# Patient Record
Sex: Female | Born: 1980 | Race: White | Hispanic: No | Marital: Single | Smoking: Never smoker
Health system: Southern US, Community
[De-identification: ages and names within clinical notes are randomized; demographics above are authoritative.]

## PROBLEM LIST (undated history)

## (undated) DIAGNOSIS — R112 Nausea with vomiting, unspecified: Secondary | ICD-10-CM

## (undated) DIAGNOSIS — T7840XA Allergy, unspecified, initial encounter: Secondary | ICD-10-CM

## (undated) DIAGNOSIS — Z9889 Other specified postprocedural states: Secondary | ICD-10-CM

## (undated) DIAGNOSIS — F419 Anxiety disorder, unspecified: Secondary | ICD-10-CM

## (undated) DIAGNOSIS — M2550 Pain in unspecified joint: Secondary | ICD-10-CM

## (undated) DIAGNOSIS — K219 Gastro-esophageal reflux disease without esophagitis: Secondary | ICD-10-CM

## (undated) DIAGNOSIS — E559 Vitamin D deficiency, unspecified: Secondary | ICD-10-CM

## (undated) DIAGNOSIS — B019 Varicella without complication: Secondary | ICD-10-CM

## (undated) DIAGNOSIS — M329 Systemic lupus erythematosus, unspecified: Secondary | ICD-10-CM

## (undated) DIAGNOSIS — F429 Obsessive-compulsive disorder, unspecified: Secondary | ICD-10-CM

## (undated) DIAGNOSIS — R7611 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 without active tuberculosis: Secondary | ICD-10-CM

## (undated) DIAGNOSIS — O039 Complete or unspecified spontaneous abortion without complication: Secondary | ICD-10-CM

## (undated) DIAGNOSIS — R110 Nausea: Secondary | ICD-10-CM

## (undated) DIAGNOSIS — F329 Major depressive disorder, single episode, unspecified: Secondary | ICD-10-CM

## (undated) DIAGNOSIS — F32A Depression, unspecified: Secondary | ICD-10-CM

## (undated) DIAGNOSIS — G43909 Migraine, unspecified, not intractable, without status migrainosus: Secondary | ICD-10-CM

## (undated) HISTORY — DX: Gastro-esophageal reflux disease without esophagitis: K21.9

## (undated) HISTORY — DX: Allergy, unspecified, initial encounter: T78.40XA

## (undated) HISTORY — DX: Obsessive-compulsive disorder, unspecified: F42.9

## (undated) HISTORY — DX: Nausea: R11.0

## (undated) HISTORY — DX: Varicella without complication: B01.9

## (undated) HISTORY — DX: Vitamin D deficiency, unspecified: E55.9

## (undated) HISTORY — DX: Anxiety disorder, unspecified: F41.9

## (undated) HISTORY — DX: Complete or unspecified spontaneous abortion without complication: O03.9

## (undated) HISTORY — DX: 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 without active tuberculosis: R76.11

## (undated) HISTORY — DX: Pain in unspecified joint: M25.50

## (undated) HISTORY — DX: Major depressive disorder, single episode, unspecified: F32.9

## (undated) HISTORY — DX: Depression, unspecified: F32.A

---
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
## 2002-05-31 ENCOUNTER — Other Ambulatory Visit: Admission: RE | Admit: 2002-05-31 | Discharge: 2002-05-31 | Payer: Self-pay | Admitting: Family Medicine

## 2004-12-02 HISTORY — PX: DILATION AND CURETTAGE OF UTERUS: SHX78

## 2004-12-24 ENCOUNTER — Inpatient Hospital Stay: Payer: Self-pay

## 2006-06-27 DIAGNOSIS — G43011 Migraine without aura, intractable, with status migrainosus: Secondary | ICD-10-CM | POA: Insufficient documentation

## 2007-07-26 ENCOUNTER — Emergency Department: Payer: Self-pay | Admitting: Unknown Physician Specialty

## 2009-06-01 DIAGNOSIS — B3731 Acute candidiasis of vulva and vagina: Secondary | ICD-10-CM | POA: Insufficient documentation

## 2009-06-01 DIAGNOSIS — B373 Candidiasis of vulva and vagina: Secondary | ICD-10-CM | POA: Insufficient documentation

## 2009-08-25 DIAGNOSIS — F32A Depression, unspecified: Secondary | ICD-10-CM | POA: Insufficient documentation

## 2009-11-20 ENCOUNTER — Inpatient Hospital Stay: Payer: Self-pay | Admitting: Unknown Physician Specialty

## 2014-04-09 ENCOUNTER — Emergency Department: Payer: Self-pay | Admitting: Emergency Medicine

## 2016-06-17 ENCOUNTER — Ambulatory Visit (INDEPENDENT_AMBULATORY_CARE_PROVIDER_SITE_OTHER): Payer: BLUE CROSS/BLUE SHIELD | Admitting: Family Medicine

## 2016-06-17 ENCOUNTER — Encounter: Payer: Self-pay | Admitting: Family Medicine

## 2016-06-17 VITALS — BP 128/62 | HR 78 | Temp 99.2°F | Resp 16 | Ht 64.0 in | Wt 125.0 lb

## 2016-06-17 DIAGNOSIS — H7290 Unspecified perforation of tympanic membrane, unspecified ear: Secondary | ICD-10-CM | POA: Insufficient documentation

## 2016-06-17 DIAGNOSIS — G43011 Migraine without aura, intractable, with status migrainosus: Secondary | ICD-10-CM

## 2016-06-17 DIAGNOSIS — R5383 Other fatigue: Secondary | ICD-10-CM | POA: Insufficient documentation

## 2016-06-17 DIAGNOSIS — J309 Allergic rhinitis, unspecified: Secondary | ICD-10-CM | POA: Insufficient documentation

## 2016-06-17 DIAGNOSIS — L301 Dyshidrosis [pompholyx]: Secondary | ICD-10-CM | POA: Insufficient documentation

## 2016-06-17 DIAGNOSIS — F329 Major depressive disorder, single episode, unspecified: Secondary | ICD-10-CM | POA: Insufficient documentation

## 2016-06-17 DIAGNOSIS — E559 Vitamin D deficiency, unspecified: Secondary | ICD-10-CM | POA: Insufficient documentation

## 2016-06-17 DIAGNOSIS — Z3009 Encounter for other general counseling and advice on contraception: Secondary | ICD-10-CM | POA: Insufficient documentation

## 2016-06-17 DIAGNOSIS — D229 Melanocytic nevi, unspecified: Secondary | ICD-10-CM | POA: Insufficient documentation

## 2016-06-17 DIAGNOSIS — F32A Depression, unspecified: Secondary | ICD-10-CM | POA: Insufficient documentation

## 2016-06-17 DIAGNOSIS — R7611 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 without active tuberculosis: Secondary | ICD-10-CM | POA: Insufficient documentation

## 2016-06-17 MED ORDER — ZONISAMIDE 100 MG PO CAPS: 100.0000 mg | ORAL_CAPSULE | Freq: Every day | ORAL | Status: DC

## 2016-06-17 NOTE — Progress Notes (Signed)
Patient: Victoria Buckley Female    DOB: [birth year]   35 y.o.   MRN: [id number] Visit Date: 06/17/2016  Today's Provider: Vernie Murders, PA   Chief Complaint  Patient presents with  . Migraine   Subjective:    Migraine  This is a chronic problem. The problem occurs intermittently. The problem has been gradually worsening. The pain is located in the temporal region. The quality of the pain is described as aching, stabbing and dull. Associated symptoms include dizziness, nausea, neck pain and vomiting. She has tried NSAIDs for the symptoms. The treatment provided mild relief.  Patient reports that she does have a history of migraine headaches. Patient reports that her last one was about 1 month ago. Patient reports that her migraine tends to be more on the left side, and it radiates down her neck.    No past medical history on file. Patient Active Problem List   Diagnosis Date Noted  . Allergic rhinitis 06/17/2016  . Family planning 06/17/2016  . Clinical depression 06/17/2016  . 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 Fatigue 06/17/2016  . Benign melanoma 06/17/2016  . 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 06/17/2016  . Ear drum perforation 06/17/2016  . Avitaminosis D 06/17/2016  . Anxiety disorder 08/25/2009  . Candidal vulvovaginitis 06/01/2009  . Tobacco use 02/24/2008  . Intractable migraine without aura with status migrainosus 06/27/2006   No past surgical history on file. No family history on file.  Allergies  Allergen Reactions  . Citalopram Hives, Itching, Nausea Only and Shortness Of Breath   No current outpatient prescriptions on file prior to visit.   No current facility-administered medications on file prior to visit.   Review of Systems  Constitutional: Positive for fatigue.  Gastrointestinal: Positive for nausea and vomiting.  Musculoskeletal: Positive for neck pain.  Neurological: Positive for dizziness and headaches.    Social History    Substance Use Topics  . Smoking status: Never Smoker   . Smokeless tobacco: Not on file  . Alcohol Use: No   Objective:   BP 128/62 mmHg  Pulse 78  Temp(Src) 99.2 F (37.3 C)  Resp 16  Ht 5\' 4"  (1.626 m)  Wt 125 lb (56.7 kg)  BMI 21.45 kg/m2  LMP 06/17/2016 (Exact Date)  Physical Exam  Constitutional: She is oriented to person, place, and time. She appears well-developed and well-nourished.  HENT:  Head: Normocephalic.  Right Ear: External ear normal.  Left Ear: External ear normal.  Some tenderness in left temple back across occiput. Some photosensitivity at onset this morning. Better now.  Eyes: Conjunctivae are normal.  Neck: Neck supple.  Cardiovascular: Normal rate and regular rhythm.   Pulmonary/Chest: Effort normal and breath sounds normal.  Abdominal: Soft. Bowel sounds are normal.  Musculoskeletal: Normal range of motion.  Neurological: She is alert and oriented to person, place, and time.  Skin: No rash noted.  Psychiatric: She has a normal mood and affect. Her behavior is normal.      Assessment & Plan:     1. Intractable migraine without aura with status migrainosus Diagnosed with migraines "20 years ago" and had very little help from Topamax with Axert prn. Was finally switched to [place] by her neurologist because the Topamax caused tingling pains in her feet. Sometimes she can abort the headache if she take a BC. Recent headache did not respond to this regimen and was having some vomiting. No nausea or significant headache at the present. This has been  the 3rd migraine in the past 3 months. Started menses today. Cycles every 23-24 days with 3-4 day menses - very regular but not on any hormones. Will get follow up labs and restart the Zonegran at her previous dose. May use Aleve and a caffeinated beverage for headache. Recheck pending lab reports. Given headache diet restrictions. - zonisamide (ZONEGRAN) 100 MG capsule; Take 1 capsule (100 mg total) by mouth  daily.  Dispense: 30 capsule; Refill: 3 - CBC with Differential/Platelet - Comprehensive metabolic panel - TSH       Vernie Murders, PA  [facility]

## 2016-06-17 NOTE — Patient Instructions (Signed)

## 2016-06-18 ENCOUNTER — Encounter: Payer: Self-pay | Admitting: Family Medicine

## 2016-06-18 ENCOUNTER — Other Ambulatory Visit: Payer: Self-pay | Admitting: Family Medicine

## 2016-06-18 MED ORDER — CYCLOBENZAPRINE HCL 5 MG PO TABS: 5.0000 mg | ORAL_TABLET | Freq: Three times a day (TID) | ORAL | Status: DC | PRN

## 2016-06-18 MED ORDER — PROMETHAZINE HCL 25 MG PO TABS: 25.0000 mg | ORAL_TABLET | Freq: Three times a day (TID) | ORAL | Status: DC | PRN

## 2016-06-19 LAB — COMPREHENSIVE METABOLIC PANEL
ALT: 18 IU/L (ref 0–32)
AST: 19 IU/L (ref 0–40)
Albumin/Globulin Ratio: 1.8 (ref 1.2–2.2)
Albumin: 4.6 g/dL (ref 3.5–5.5)
Alkaline Phosphatase: 77 IU/L (ref 39–117)
BUN/Creatinine Ratio: 11 (ref 9–23)
BUN: 9 mg/dL (ref 6–20)
Bilirubin Total: 0.4 mg/dL (ref 0.0–1.2)
CO2: 21 mmol/L (ref 18–29)
Calcium: 9 mg/dL (ref 8.7–10.2)
Chloride: 98 mmol/L (ref 96–106)
Creatinine, Ser: 0.83 mg/dL (ref 0.57–1.00)
GFR calc Af Amer: 104 mL/min/{1.73_m2} (ref >59)
GFR calc non Af Amer: 90 mL/min/{1.73_m2} (ref >59)
Globulin, Total: 2.6 g/dL (ref 1.5–4.5)
Glucose: 93 mg/dL (ref 65–99)
Potassium: 4 mmol/L (ref 3.5–5.2)
Sodium: 136 mmol/L (ref 134–144)
Total Protein: 7.2 g/dL (ref 6.0–8.5)

## 2016-06-19 LAB — CBC WITH DIFFERENTIAL/PLATELET
Basophils Absolute: 0 10*3/uL (ref 0.0–0.2)
Basos: 0 %
EOS (ABSOLUTE): 0 10*3/uL (ref 0.0–0.4)
Eos: 0 %
Hematocrit: 41.9 % (ref 34.0–46.6)
Hemoglobin: 14.1 g/dL (ref 11.1–15.9)
Immature Grans (Abs): 0 10*3/uL (ref 0.0–0.1)
Immature Granulocytes: 0 %
Lymphocytes Absolute: 2.2 10*3/uL (ref 0.7–3.1)
Lymphs: 23 %
MCH: 32.3 pg (ref 26.6–33.0)
MCHC: 33.7 g/dL (ref 31.5–35.7)
MCV: 96 fL (ref 79–97)
Monocytes Absolute: 0.6 10*3/uL (ref 0.1–0.9)
Monocytes: 7 %
Neutrophils Absolute: 6.8 10*3/uL (ref 1.4–7.0)
Neutrophils: 70 %
Platelets: 244 10*3/uL (ref 150–379)
RBC: 4.37 x10E6/uL (ref 3.77–5.28)
RDW: 12.9 % (ref 12.3–15.4)
WBC: 9.7 10*3/uL (ref 3.4–10.8)

## 2016-06-19 LAB — TSH: TSH: 0.822 u[IU]/mL (ref 0.450–4.500)

## 2016-07-26 ENCOUNTER — Encounter: Payer: Self-pay | Admitting: Family Medicine

## 2016-08-02 ENCOUNTER — Encounter: Payer: BLUE CROSS/BLUE SHIELD | Admitting: Family Medicine

## 2016-09-05 ENCOUNTER — Emergency Department
Admission: EM | Admit: 2016-09-05 | Discharge: 2016-09-05 | Disposition: A | Discharge status: Home / Self Care | Payer: BLUE CROSS/BLUE SHIELD | Attending: Emergency Medicine | Admitting: Emergency Medicine

## 2016-09-05 ENCOUNTER — Encounter: Payer: Self-pay | Admitting: Emergency Medicine

## 2016-09-05 DIAGNOSIS — X58XXXA Exposure to other specified factors, initial encounter: Secondary | ICD-10-CM | POA: Diagnosis not present

## 2016-09-05 DIAGNOSIS — S0501XA Injury of conjunctiva and corneal abrasion without foreign body, right eye, initial encounter: Secondary | ICD-10-CM | POA: Insufficient documentation

## 2016-09-05 DIAGNOSIS — H5711 Ocular pain, right eye: Secondary | ICD-10-CM | POA: Diagnosis present

## 2016-09-05 DIAGNOSIS — Y999 Unspecified external cause status: Secondary | ICD-10-CM | POA: Diagnosis not present

## 2016-09-05 DIAGNOSIS — Y929 Unspecified place or not applicable: Secondary | ICD-10-CM | POA: Diagnosis not present

## 2016-09-05 DIAGNOSIS — Y939 Activity, unspecified: Secondary | ICD-10-CM | POA: Diagnosis not present

## 2016-09-05 DIAGNOSIS — H18821 Corneal disorder due to contact lens, right eye: Secondary | ICD-10-CM

## 2016-09-05 HISTORY — DX: Migraine, unspecified, not intractable, without status migrainosus: G43.909

## 2016-09-05 MED ORDER — EYE WASH OPHTH SOLN
OPHTHALMIC | Status: AC
  Filled 2016-09-05: qty 118

## 2016-09-05 MED ORDER — TETRACAINE HCL 0.5 % OP SOLN
OPHTHALMIC | Status: AC
  Filled 2016-09-05: qty 2

## 2016-09-05 MED ORDER — FLUORESCEIN SODIUM 1 MG OP STRP
ORAL_STRIP | OPHTHALMIC | Status: AC
  Filled 2016-09-05: qty 1

## 2016-09-05 MED ORDER — CIPROFLOXACIN HCL 0.3 % OP SOLN: 1.0000 [drp] | OPHTHALMIC | 0 refills | Status: AC

## 2016-09-05 MED ORDER — HYDROCODONE-ACETAMINOPHEN 5-325 MG PO TABS: 1.0000 | ORAL_TABLET | ORAL | 0 refills | Status: DC | PRN

## 2016-09-05 NOTE — ED Provider Notes (Signed)
[facility] Emergency Department Provider Note  ____________________________________________   First MD Initiated Contact with Patient 09/05/16 1733     (approximate)  I have reviewed the triage vital signs and the nursing notes.   HISTORY  Chief Complaint Eye Pain    HPI Victoria Buckley is a 35 y.o. female who presents with right eye pain since 2:30 this afternoon. Patient took her contacts out about 15 minutes after noticing pain. Since then pain has continued to worsen. Patient also experiencing clear tearing of eye, red eye, blurred vision, photophobia, and headache. Denies trauma to the eye, sleeping in contacts, known foreign body in eye. Denies any recent fevers, purulent drainage, pain in left eye.    Past Medical History:  Diagnosis Date  . Migraines     Patient Active Problem List   Diagnosis Date Noted  . Allergic rhinitis 06/17/2016  . Family planning 06/17/2016  . Clinical depression 06/17/2016  . 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 Fatigue 06/17/2016  . Benign melanoma 06/17/2016  . 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 06/17/2016  . Ear drum perforation 06/17/2016  . Avitaminosis D 06/17/2016  . Anxiety disorder 08/25/2009  . Candidal vulvovaginitis 06/01/2009  . Tobacco use 02/24/2008  . Intractable migraine without aura with status migrainosus 06/27/2006    History reviewed. No pertinent surgical history.  Prior to Admission medications   Medication Sig Start Date End Date Taking? Authorizing Provider  ciprofloxacin (CILOXAN) 0.3 % ophthalmic solution Place 1 drop into both eyes every 2 (two) hours. Administer 1 drop, every 2 hours, while awake, for 2 days. Then 1 drop, every 4 hours, while awake, for the next 5 days. 09/05/16 09/10/16  Pierce Crane Raina Sole, PA-C  cyclobenzaprine (FLEXERIL) 5 MG tablet Take 1 tablet (5 mg total) by mouth 3 (three) times daily as needed for muscle spasms. 06/18/16   Vickki Muff Chrismon, PA    promethazine (PHENERGAN) 25 MG tablet Take 1 tablet (25 mg total) by mouth every 8 (eight) hours as needed for nausea or vomiting. 06/18/16   Vickki Muff Chrismon, PA  zonisamide (ZONEGRAN) 100 MG capsule Take 1 capsule (100 mg total) by mouth daily. 06/17/16   Vickki Muff Chrismon, PA    Allergies Citalopram  No family history on file.  Social History Social History  Substance Use Topics  . Smoking status: Never Smoker  . Smokeless tobacco: Never Used  . Alcohol use 0.0 oz/week     Comment: occasionally    Review of Systems Constitutional: No fever/chills Eyes: Positive for blurred vision, red eye, tearing and pain in right eye. Photophobia. Left eye is normal.  ENT: No sore throat, nasal drainage.  Gastrointestinal: No nausea, no vomiting.   Neurological: Positive for headache.  ____________________________________________   PHYSICAL EXAM:  VITAL SIGNS: ED Triage Vitals [09/05/16 1731]  Enc Vitals Group     BP (!) 145/91     Pulse Rate 67     Resp 18     Temp 98.2 F (36.8 C)     Temp Source Oral     SpO2 100 %     Weight 128 lb (58.1 kg)     Height 5\' 4"  (1.626 m)     Head Circumference      Peak Flow      Pain Score      Pain Loc      Pain Edu?      Excl. in [place]?     Constitutional: Alert and oriented. Well appearing  and in no acute distress. Eyes: Conjunctivae mildly injected right eye, left conjunctiva normal. Right sclera is injected. On examination with fluorescein dye and Woods lamp, patient's right eye shows increased uptake around iris where contact was situated. PERRL. EOMI. Head: Atraumatic. Nose: No congestion/rhinnorhea. Neck: No stridor. Full ROM without pain or difficulty. Supple.  Respiratory: Normal respiratory effort.  No retractions.  Musculoskeletal: Full ROM in all extremities without pain or difficulty.  Neurologic:  Normal speech and language. No gross focal neurologic deficits are appreciated.  Skin:  Skin is warm, dry and intact. No rash  noted. Psychiatric: Mood and affect are normal. Speech and behavior are normal.  ____________________________________________   LABS (all labs ordered are listed, but only abnormal results are displayed)  Labs Reviewed - No data to display ____________________________________________  EKG  None.  ____________________________________________  RADIOLOGY  None.   ____________________________________________   PROCEDURES  Procedure(s) performed: None  Procedures  Critical Care performed: No  ____________________________________________   INITIAL IMPRESSION / ASSESSMENT AND PLAN / ED COURSE  Pertinent labs & imaging results that were available during my care of the patient were reviewed by me and considered in my medical decision making (see chart for details).  Patient presentation consistent with corneal abrasion due to contact lens. Patient given prescription for ciprofloxacin ophthalmic drops. Patient instructed to wear glasses for the next week. Patient to follow up with her ophthalmologist if symptoms worsen or fail to improve. No other emergency medicine complaints at this time.   Clinical Course     ____________________________________________   FINAL CLINICAL IMPRESSION(S) / ED DIAGNOSES  Final diagnoses:  Corneal abrasion of right eye due to contact lens      NEW MEDICATIONS STARTED DURING THIS VISIT:  New Prescriptions   CIPROFLOXACIN (CILOXAN) 0.3 % OPHTHALMIC SOLUTION    Place 1 drop into both eyes every 2 (two) hours. Administer 1 drop, every 2 hours, while awake, for 2 days. Then 1 drop, every 4 hours, while awake, for the next 5 days.     Note:  This document was prepared using Dragon voice recognition software and may include unintentional dictation errors.   Arlyss Repress, PA-C 09/05/16 1826    Schuyler Amor, MD 09/05/16 [phone]

## 2016-09-05 NOTE — ED Triage Notes (Signed)
Patient presents to the ED with right eye pain since 2:45.  Patient states she noticed her eyes were watering so she removed her contacts and patient reports that immediately her right eye became very swollen and painful.  Patient states, "it feels like a needle is poking through my eye."  Patient reports blurry vision.

## 2018-07-10 ENCOUNTER — Encounter: Payer: Self-pay | Admitting: Family Medicine

## 2018-07-15 ENCOUNTER — Encounter: Payer: Self-pay | Admitting: Gastroenterology

## 2018-07-15 ENCOUNTER — Encounter: Payer: Self-pay | Admitting: Neurology

## 2018-07-15 ENCOUNTER — Ambulatory Visit: Payer: BLUE CROSS/BLUE SHIELD | Admitting: Neurology

## 2018-07-15 VITALS — BP 132/84 | HR 71 | Ht 64.0 in | Wt 119.0 lb

## 2018-07-15 DIAGNOSIS — R61 Generalized hyperhidrosis: Secondary | ICD-10-CM

## 2018-07-15 DIAGNOSIS — R509 Fever, unspecified: Secondary | ICD-10-CM | POA: Diagnosis not present

## 2018-07-15 DIAGNOSIS — M2550 Pain in unspecified joint: Secondary | ICD-10-CM

## 2018-07-15 DIAGNOSIS — R232 Flushing: Secondary | ICD-10-CM | POA: Diagnosis not present

## 2018-07-15 DIAGNOSIS — F439 Reaction to severe stress, unspecified: Secondary | ICD-10-CM

## 2018-07-15 DIAGNOSIS — E538 Deficiency of other specified B group vitamins: Secondary | ICD-10-CM

## 2018-07-15 MED ORDER — SERTRALINE HCL 50 MG PO TABS: 50.0000 mg | ORAL_TABLET | Freq: Every day | ORAL | 11 refills | Status: DC

## 2018-07-15 NOTE — Progress Notes (Signed)
[facility]    Provider:  Dr Jaynee Eagles Referring Provider: Margo Common, PA Primary Care Physician:  Victoria Common, PA  CC:  Dizziness, hot flashes  HPI:  Victoria Buckley is a 37 y.o. female here as a referral from Dr. Natale Milch for migraines. Migraines since HS. She had aura, nausea, throwing up, pulsating/pounding, unilateral, +photo/phono phobia, movement makes it worse. At the end of may she started feeling dizzy, hot flashes, more pressure in the head. She is still having night sweats and nausea. Dizziness has improved. Her face is like a cherry tomato, she gets nauseated. Now she gets hot flashes that are severe. She takes zofran weekly. She gets severe nausea. She sweats at night. No headaches or migraines currently, hot flashes/nausea. Lots of stress. No other focal neurologic deficits, associated symptoms, inciting events or modifiable factors.  Reviewed notes, labs and imaging from outside physicians, which showed:  TSH, FSH, LH, cbc and cmp normal, MCV 100  Reviewed primary care notes patient has a prior history of migraines no other significant problems she does have ADHD major depressive disorder stress.  She reported dizziness and headache she did report night sweats but no fever per se although does feel feverish with hot flashes reviewed exam which was all normal including cardiovascular constitutional psychiatric neck musculoskeletal GI extremities lymphatic neurologic.  Patient reported complaints of dizziness and a weird feeling in the head, she denied any neuro deficits, headache resolved, labs were drawn including thyroid vitamin D (which was low) also complaining of liquid diarrhea green to brown no mucus no blood after eating abdominal pain.  Headaches resolved.  Review of Systems: Patient complains of symptoms per HPI as well as the following symptoms: snoring, numbness, tremor, feeling hot, soring. Pertinent negatives and positives per HPI. All  others negative.   Social History   Socioeconomic History  . Marital status: Single    Spouse name: Not on file  . Number of children: Not on file  . Years of education: Not on file  . Highest education level: Not on file  Occupational History  . Not on file  Social Needs  . Financial resource strain: Not on file  . Food insecurity:    Worry: Not on file    Inability: Not on file  . Transportation needs:    Medical: Not on file    Non-medical: Not on file  Tobacco Use  . Smoking status: Never Smoker  . Smokeless tobacco: Never Used  Substance and Sexual Activity  . Alcohol use: Yes    Alcohol/week: 7.0 - 14.0 standard drinks    Types: 7 - 14 Glasses of wine per week  . Drug use: No  . Sexual activity: Not on file  Lifestyle  . Physical activity:    Days per week: Not on file    Minutes per session: Not on file  . Stress: Not on file  Relationships  . Social connections:    Talks on phone: Not on file    Gets together: Not on file    Attends religious service: Not on file    Active member of club or organization: Not on file    Attends meetings of clubs or organizations: Not on file    Relationship status: Not on file  . Intimate partner violence:    Fear of current or ex partner: Not on file    Emotionally abused: Not on file    Physically abused: Not on file    Forced  sexual activity: Not on file  Other Topics Concern  . Not on file  Social History Narrative  . Not on file    Family History  Problem Relation Age of Onset  . Osteoporosis Mother   . Scoliosis Mother   . ADD / ADHD Mother   . Stroke Maternal Grandmother   . Dementia Maternal Grandfather     Past Medical History:  Diagnosis Date  . Anxiety   . Migraines   . Vitamin D deficiency     Past Surgical History:  Procedure Laterality Date  . BUNIONECTOMY  1998  . DILATION AND CURETTAGE OF UTERUS  2006    Current Outpatient Medications  Medication Sig Dispense Refill  .  Butalbital-APAP-Caffeine 50-300-40 MG CAPS TK ONE C PO TID PRN  0  . dexamethasone 0.5 MG/5ML elixir   0  . ondansetron (ZOFRAN-ODT) 4 MG disintegrating tablet DIS ONE T PO QID PRN  0  . Vitamin D, Ergocalciferol, (DRISDOL) 50000 units CAPS capsule TK ONE C PO Q WK  3  . sertraline (ZOLOFT) 50 MG tablet Take 1 tablet (50 mg total) by mouth daily. 30 tablet 11   No current facility-administered medications for this visit.     Allergies as of 07/15/2018 - Review Complete 07/15/2018  Allergen Reaction Noted  . Citalopram Hives, Itching, Nausea Only, and Shortness Of Breath 06/17/2016    Vitals: BP 132/84   Pulse 71   Ht 5\' 4"  (1.626 m)   Wt 119 lb (54 kg)   BMI 20.43 kg/m  Last Weight:  Wt Readings from Last 1 Encounters:  07/15/18 119 lb (54 kg)   Last Height:   Ht Readings from Last 1 Encounters:  07/15/18 5\' 4"  (1.626 m)    Physical exam: Exam: Gen: NAD, conversant, well nourised,  well groomed                     CV: RRR, no MRG. No Carotid Bruits. No peripheral edema, warm, nontender Eyes: Conjunctivae clear without exudates or hemorrhage  Neuro: Detailed Neurologic Exam  Speech:    Speech is normal; fluent and spontaneous with normal comprehension.  Cognition:    The patient is oriented to person, place, and time;     recent and remote memory intact;     language fluent;     normal attention, concentration,     fund of knowledge Cranial Nerves:    The pupils are equal, round, and reactive to light. The fundi are normal and spontaneous venous pulsations are present. Visual fields are full to finger confrontation. Extraocular movements are intact. Trigeminal sensation is intact and the muscles of mastication are normal. The face is symmetric. The palate elevates in the midline. Hearing intact. Voice is normal. Shoulder shrug is normal. The tongue has normal motion without fasciculations.   Coordination:    Normal finger to nose and heel to shin. Normal rapid  alternating movements.   Gait:    Heel-toe and tandem gait are normal.   Motor Observation:    No asymmetry, no atrophy, and no involuntary movements noted. Tone:    Normal muscle tone.    Posture:    Posture is normal. normal erect    Strength:    Strength is V/V in the upper and lower limbs.      Sensation: intact to LT     Reflex Exam:  DTR's:    Deep tendon reflexes in the upper and lower extremities are normal bilaterally.  Toes:    The toes are downgoing bilaterally.   Clonus:    Clonus is absent.      Assessment/Plan:  37 year old with a history of migraines having weekly hot flashes face gets red "like a cherry tomato", also night sweats soaking sheets at night, nausea and vomiting without headache, without aura, without other migrainous symptoms. Very unlikely that migraines would be the cause of these symptoms.   Neurologic conditions that may cause hot flashes and night sweats may include autonomic disorders or neurodegenerative disorders but patient has no risk factors for these and exam is normal. Her symptoms need to be worked up in a primary care setting and with other specialists such as GI and endocrinology. Needs a thorough malignancy screen.  - she tests positive for TB but prior Xrays negative and no other respiratory symptoms. Offered Xray she declines at this time.will try to locate last xray results, request records and review - She has upcoming GI appt and needs a thorough GI eval including colonoscopy so encouraged her to keep appointment - Needs a thorough workup by pcp including evaluation for cancers.  - She has not had a pap smear or mammogram in 3 years, discussed needs to do both - offered MRI brain and cervical spine but symptoms unlikely localized to the brain or cervical spine, she declined at this time - declined infectious workup such as HIV, she has been tested and no risk factors - Will check B12 (MCV 100) and brucellosis, she declined  other lab tests such as lyme or infectious/autoimmune/metabolic workup - could be anxiety, she is under much stress, start Zoloft she took it in the past without issues - RTC or call for migraines  Orders Placed This Encounter  Procedures  . Brucella IgG, IgM  . B12 and Folate Panel  . Methylmalonic acid, serum     Discussed: To prevent or relieve headaches, try the following: Cool Compress. Lie down and place a cool compress on your head.  Avoid headache triggers. If certain foods or odors seem to have triggered your migraines in the past, avoid them. A headache diary might help you identify triggers.  Include physical activity in your daily routine. Try a daily walk or other moderate aerobic exercise.  Manage stress. Find healthy ways to cope with the stressors, such as delegating tasks on your to-do list.  Practice relaxation techniques. Try deep breathing, yoga, massage and visualization.  Eat regularly. Eating regularly scheduled meals and maintaining a healthy diet might help prevent headaches. Also, drink plenty of fluids.  Follow a regular sleep schedule. Sleep deprivation might contribute to headaches Consider biofeedback. With this mind-body technique, you learn to control certain bodily functions - such as muscle tension, heart rate and blood pressure - to prevent headaches or reduce headache pain.    Proceed to emergency room if you experience new or worsening symptoms or symptoms do not resolve, if you have new neurologic symptoms or if headache is severe, or for any concerning symptom.   Provided education and documentation from [employer] headache [employer] toolbox including articles on: chronic migraine medication overuse headache, chronic migraines, prevention of migraines, behavioral and other nonpharmacologic treatments for headache.   Sarina Ill, MD  [facility] [address] [place], [place] [address]  Phone [phone] Fax  [phone]

## 2018-07-15 NOTE — Patient Instructions (Signed)
Labs  Vitamin B12 Deficiency Vitamin B12 deficiency occurs when the body does not have enough vitamin B12. Vitamin B12 is an important vitamin. The body needs vitamin B12:  To make red blood cells.  To make DNA. This is the genetic material inside cells.  To help the nerves work properly so they can carry messages from the brain to the body.  Vitamin B12 deficiency can cause various health problems, such as a low red blood cell count (anemia) or nerve damage. What are the causes? This condition may be caused by:  Not eating enough foods that contain vitamin B12.  Not having enough stomach acid and digestive fluids to properly absorb vitamin B12 from the food that you eat.  Certain digestive system diseases that make it hard to absorb vitamin B12. These diseases include Crohn disease, chronic pancreatitis, and cystic fibrosis.  Pernicious anemia. This is a condition in which the body does not make enough of a protein (intrinsic factor), resulting in too few red blood cells.  Having a surgery in which part of the stomach or small intestine is removed.  Taking certain medicines that make it hard for the body to absorb vitamin B12. These medicines include: ? Heartburn medicine (antacids and proton pump inhibitors). ? An antibiotic medicine called neomycin. ? Some medicines that are used to treat diabetes, tuberculosis, gout, or high cholesterol.  What increases the risk? The following factors may make you more likely to develop a B12 deficiency:  Being older than age 59.  Eating a vegetarian or vegan diet, especially while you are pregnant.  Eating a poor diet while you are pregnant.  Taking certain drugs.  Having alcoholism.  What are the signs or symptoms? In some cases, there are no symptoms of this condition. If the condition leads to anemia or nerve damage, various symptoms can occur, such as:  Weakness.  Fatigue.  Loss of appetite.  Weight loss.  Numbness  or tingling in your hands and feet.  Redness and burning of the tongue.  Confusion or memory problems.  Depression.  Sensory problems, such as color blindness, ringing in the ears, or loss of taste.  Diarrhea or constipation.  Trouble walking.  If anemia is severe, symptoms can include:  Shortness of breath.  Dizziness.  Rapid heart rate (tachycardia).  How is this diagnosed? This condition may be diagnosed with a blood test to measure the level of vitamin B12 in your blood. You may have other tests to help find the cause of your vitamin B12 deficiency. These tests may include:  A complete blood count (CBC). This is a group of tests that measure certain characteristics of blood cells.  A blood test to measure intrinsic factor.  An endoscopy. In this procedure, a thin tube with a camera on the end is used to look into your stomach or intestines.  How is this treated? Treatment for this condition depends on the cause. Common treatment options include:  Changing your eating and drinking habits, such as: ? Eating more foods that contain vitamin B12. ? Drinking less alcohol or no alcohol.  Taking vitamin B12 supplements. Your health care provider will tell you which dosage is best for you.  Getting vitamin B12 injections.  Follow these instructions at home:  Take supplements only as told by your health care provider. Follow the directions carefully.  Get any injections that are prescribed by your health care provider.  Do not miss your appointments.  Eat lots of healthy foods that  contain vitamin B12. Ask your health care provider if you should work with a dietitian. Foods that contain vitamin B12 include: ? Meat. ? Meat from birds (poultry). ? Fish. ? Eggs. ? Cereal and dairy products that are fortified. This means that vitamin B12 has been added to the food. Check the label on the package to see if the food is fortified.  Do not abuse alcohol.  Keep all follow-up  visits as told by your health care provider. This is important. Contact a health care provider if:  Your symptoms come back. Get help right away if:  You develop shortness of breath.  You have chest pain.  You become dizzy or you lose consciousness. This information is not intended to replace advice given to you by your health care provider. Make sure you discuss any questions you have with your health care provider. Document Released: 02/10/2012 Document Revised: 05/01/2016 Document Reviewed: 04/05/2015 Elsevier Interactive Patient Education  2018 [employer].

## 2018-07-19 LAB — B12 AND FOLATE PANEL
Folate: 10.5 ng/mL (ref >3.0)
Vitamin B-12: 542 pg/mL (ref 232–1245)

## 2018-07-19 LAB — METHYLMALONIC ACID, SERUM: Methylmalonic Acid: 92 nmol/L (ref 0–378)

## 2018-07-19 LAB — BRUCELLA IGG, IGM
Brucella Antibody IgG, EIA: NEGATIVE
Brucella Antibody IgM, EIA: UNDETERMINED

## 2018-09-11 ENCOUNTER — Ambulatory Visit: Payer: BLUE CROSS/BLUE SHIELD | Admitting: Gastroenterology

## 2018-09-22 ENCOUNTER — Encounter (HOSPITAL_COMMUNITY): Payer: Self-pay | Admitting: Emergency Medicine

## 2018-09-22 ENCOUNTER — Other Ambulatory Visit: Payer: Self-pay

## 2018-09-22 ENCOUNTER — Emergency Department (HOSPITAL_COMMUNITY): Payer: BLUE CROSS/BLUE SHIELD

## 2018-09-22 ENCOUNTER — Emergency Department (HOSPITAL_COMMUNITY)
Admission: EM | Admit: 2018-09-22 | Discharge: 2018-09-22 | Disposition: A | Discharge status: Home / Self Care | Payer: BLUE CROSS/BLUE SHIELD | Attending: Emergency Medicine | Admitting: Emergency Medicine

## 2018-09-22 DIAGNOSIS — K529 Noninfective gastroenteritis and colitis, unspecified: Secondary | ICD-10-CM | POA: Diagnosis not present

## 2018-09-22 DIAGNOSIS — Z79899 Other long term (current) drug therapy: Secondary | ICD-10-CM | POA: Diagnosis not present

## 2018-09-22 DIAGNOSIS — R1010 Upper abdominal pain, unspecified: Secondary | ICD-10-CM | POA: Diagnosis present

## 2018-09-22 DIAGNOSIS — R1011 Right upper quadrant pain: Secondary | ICD-10-CM | POA: Insufficient documentation

## 2018-09-22 LAB — URINALYSIS, ROUTINE W REFLEX MICROSCOPIC
Bacteria, UA: NONE SEEN
Glucose, UA: 50 mg/dL — AB
Hgb urine dipstick: NEGATIVE
Ketones, ur: 5 mg/dL — AB
Leukocytes, UA: NEGATIVE
Nitrite: NEGATIVE
Protein, ur: 100 mg/dL — AB
Specific Gravity, Urine: 1.027 (ref 1.005–1.030)
pH: 5 (ref 5.0–8.0)

## 2018-09-22 LAB — COMPREHENSIVE METABOLIC PANEL
ALT: 51 U/L — ABNORMAL HIGH (ref 0–44)
AST: 49 U/L — ABNORMAL HIGH (ref 15–41)
Albumin: 4.7 g/dL (ref 3.5–5.0)
Alkaline Phosphatase: 89 U/L (ref 38–126)
Anion gap: 16 — ABNORMAL HIGH (ref 5–15)
BUN: 10 mg/dL (ref 6–20)
CO2: 22 mmol/L (ref 22–32)
Calcium: 9.2 mg/dL (ref 8.9–10.3)
Chloride: 99 mmol/L (ref 98–111)
Creatinine, Ser: 0.98 mg/dL (ref 0.44–1.00)
GFR calc Af Amer: >60 mL/min (ref >60)
GFR calc non Af Amer: >60 mL/min (ref >60)
Glucose, Bld: 197 mg/dL — ABNORMAL HIGH (ref 70–99)
Potassium: 3.5 mmol/L (ref 3.5–5.1)
Sodium: 137 mmol/L (ref 135–145)
Total Bilirubin: 1.1 mg/dL (ref 0.3–1.2)
Total Protein: 8.3 g/dL — ABNORMAL HIGH (ref 6.5–8.1)

## 2018-09-22 LAB — CBC
HCT: 43.9 % (ref 36.0–46.0)
Hemoglobin: 14.9 g/dL (ref 12.0–15.0)
MCH: 32.2 pg (ref 26.0–34.0)
MCHC: 33.9 g/dL (ref 30.0–36.0)
MCV: 94.8 fL (ref 80.0–100.0)
Platelets: 263 10*3/uL (ref 150–400)
RBC: 4.63 MIL/uL (ref 3.87–5.11)
RDW: 12.1 % (ref 11.5–15.5)
WBC: 16.2 10*3/uL — ABNORMAL HIGH (ref 4.0–10.5)
nRBC: 0 % (ref 0.0–0.2)

## 2018-09-22 LAB — LIPASE, BLOOD: Lipase: 28 U/L (ref 11–51)

## 2018-09-22 LAB — PREGNANCY, URINE: Preg Test, Ur: NEGATIVE

## 2018-09-22 MED ORDER — MORPHINE SULFATE (PF) 4 MG/ML IV SOLN
4.0000 mg | Freq: Once | INTRAVENOUS | Status: AC
  Administered 2018-09-22: 4 mg via INTRAVENOUS
  Filled 2018-09-22: qty 1

## 2018-09-22 MED ORDER — ONDANSETRON HCL 4 MG PO TABS: 4.0000 mg | ORAL_TABLET | Freq: Four times a day (QID) | ORAL | 0 refills | Status: DC | PRN

## 2018-09-22 MED ORDER — METRONIDAZOLE 500 MG PO TABS: 500.0000 mg | ORAL_TABLET | Freq: Two times a day (BID) | ORAL | 0 refills | Status: DC

## 2018-09-22 MED ORDER — CIPROFLOXACIN HCL 500 MG PO TABS: 500.0000 mg | ORAL_TABLET | Freq: Two times a day (BID) | ORAL | 0 refills | Status: DC

## 2018-09-22 MED ORDER — IOPAMIDOL (ISOVUE-300) INJECTION 61%
100.0000 mL | Freq: Once | INTRAVENOUS | Status: AC | PRN
  Administered 2018-09-22: 100 mL via INTRAVENOUS

## 2018-09-22 MED ORDER — TRAMADOL HCL 50 MG PO TABS: 50.0000 mg | ORAL_TABLET | Freq: Four times a day (QID) | ORAL | 0 refills | Status: DC | PRN

## 2018-09-22 MED ORDER — SODIUM CHLORIDE 0.9 % IV BOLUS
1000.0000 mL | Freq: Once | INTRAVENOUS | Status: AC
  Administered 2018-09-22: 1000 mL via INTRAVENOUS

## 2018-09-22 MED ORDER — ONDANSETRON 4 MG PO TBDP
4.0000 mg | ORAL_TABLET | Freq: Once | ORAL | Status: DC
  Filled 2018-09-22: qty 1

## 2018-09-22 MED ORDER — CIPROFLOXACIN IN D5W 400 MG/200ML IV SOLN
400.0000 mg | Freq: Once | INTRAVENOUS | Status: AC
  Administered 2018-09-22: 400 mg via INTRAVENOUS
  Filled 2018-09-22: qty 200

## 2018-09-22 MED ORDER — METRONIDAZOLE IN NACL 5-0.79 MG/ML-% IV SOLN
500.0000 mg | Freq: Once | INTRAVENOUS | Status: AC
  Administered 2018-09-22: 500 mg via INTRAVENOUS
  Filled 2018-09-22: qty 100

## 2018-09-22 NOTE — ED Notes (Signed)
Call to lab add labs

## 2018-09-22 NOTE — ED Notes (Signed)
Ultrasound at bedside

## 2018-09-22 NOTE — ED Triage Notes (Signed)
Pt c/o mid upper abdominal pain that began at 0100. Endorses n/v/d.

## 2018-09-22 NOTE — ED Notes (Signed)
Pt declined zofran saying she had been taking wihout it helping  She also reports drinking water "so I will have something to throw up. If I dont have something on my stomach, I dry heave and (urinate) on myself".

## 2018-09-22 NOTE — ED Provider Notes (Signed)
[facility] EMERGENCY DEPARTMENT Provider Note   CSN: [id number] Arrival date & time: 09/22/18  1356     History   Chief Complaint Chief Complaint  Patient presents with  . Abdominal Pain    HPI Victoria Buckley is a 37 y.o. female.  HPI Patient presents with upper abdominal pain radiating through to the back that started around 1 AM this morning.  Associated with nausea and multiple episodes of vomiting.  No blood in the vomit.  Patient has had some loose stool without blood in stool.  No definite fever or chills.  No previously similar symptoms.  Pain has been constant since onset.  No previous abdominal surgeries. Past Medical History:  Diagnosis Date  . Anxiety   . Migraines   . Vitamin D deficiency     Patient Active Problem List   Diagnosis Date Noted  . Allergic rhinitis 06/17/2016  . Family planning 06/17/2016  . Clinical depression 06/17/2016  . 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 Fatigue 06/17/2016  . Benign melanoma 06/17/2016  . 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 06/17/2016  . Ear drum perforation 06/17/2016  . Avitaminosis D 06/17/2016  . Anxiety disorder 08/25/2009  . Candidal vulvovaginitis 06/01/2009  . Tobacco use 02/24/2008  . Intractable migraine without aura with status migrainosus 06/27/2006    Past Surgical History:  Procedure Laterality Date  . BUNIONECTOMY  1998  . DILATION AND CURETTAGE OF UTERUS  2006     OB History   None      Home Medications    Prior to Admission medications   Medication Sig Start Date End Date Taking? Authorizing Provider  Butalbital-APAP-Caffeine 50-300-40 MG CAPS Take 1 tablet by mouth 3 (three) times daily as needed (for migraines).  04/10/18  Yes [provider]  doxepin (SINEQUAN) 25 MG capsule Take 25 mg by mouth at bedtime as needed (for sleep).  07/28/18  Yes [provider]  ondansetron (ZOFRAN-ODT) 4 MG disintegrating tablet Take 4 mg by mouth 4 (four) times daily as needed  for nausea or vomiting.  06/05/18  Yes [provider]  sertraline (ZOLOFT) 50 MG tablet Take 1 tablet (50 mg total) by mouth daily. 07/15/18  Yes Melvenia Beam, MD  Vitamin D, Ergocalciferol, (DRISDOL) 50000 units CAPS capsule Take 50,000 Units by mouth every Friday.  04/13/18  Yes [provider]  ciprofloxacin (CIPRO) 500 MG tablet Take 1 tablet (500 mg total) by mouth 2 (two) times daily. One po bid x 7 days 09/22/18   Julianne Rice, MD  metroNIDAZOLE (FLAGYL) 500 MG tablet Take 1 tablet (500 mg total) by mouth 2 (two) times daily. One po bid x 7 days 09/22/18   Julianne Rice, MD  ondansetron (ZOFRAN) 4 MG tablet Take 1 tablet (4 mg total) by mouth every 6 (six) hours as needed for nausea or vomiting. 09/22/18   Julianne Rice, MD  traMADol (ULTRAM) 50 MG tablet Take 1 tablet (50 mg total) by mouth every 6 (six) hours as needed for severe pain. 09/22/18   Julianne Rice, MD    Family History Family History  Problem Relation Age of Onset  . Osteoporosis Mother   . Scoliosis Mother   . ADD / ADHD Mother   . Stroke Maternal Grandmother   . Dementia Maternal Grandfather     Social History Social History   Tobacco Use  . Smoking status: Never Smoker  . Smokeless tobacco: Never Used  Substance Use Topics  . Alcohol use: Yes  Alcohol/week: 0.0 standard drinks    Comment: 2-3 vodka drinks/day  . Drug use: Yes    Types: Marijuana    Comment: last use yesterday     Allergies   Citalopram   Review of Systems Review of Systems  Constitutional: Negative for chills and fever.  Eyes: Negative for visual disturbance.  Respiratory: Negative for cough and shortness of breath.   Cardiovascular: Negative for chest pain.  Gastrointestinal: Positive for abdominal pain, diarrhea, nausea and vomiting. Negative for blood in stool and constipation.  Genitourinary: Negative for dysuria, flank pain, frequency and pelvic pain.  Musculoskeletal: Positive for back  pain. Negative for myalgias, neck pain and neck stiffness.  Skin: Negative for rash and wound.  Neurological: Negative for dizziness, weakness, light-headedness, numbness and headaches.  Psychiatric/Behavioral: The patient is nervous/anxious.   All other systems reviewed and are negative.    Physical Exam Updated Vital Signs BP 111/72 (BP Location: Right Arm)   Pulse 82   Temp 98.9 F (37.2 C) (Oral)   Resp 18   Ht 5\' 4"  (1.626 m)   Wt 54 kg   LMP 09/12/2018 (Exact Date)   SpO2 98%   BMI 20.43 kg/m   Physical Exam  Constitutional: She is oriented to person, place, and time. She appears well-developed and well-nourished.  Anxious appearing  HENT:  Head: Normocephalic and atraumatic.  Mouth/Throat: Oropharynx is clear and moist. No oropharyngeal exudate.  Eyes: Pupils are equal, round, and reactive to light. EOM are normal.  Neck: Normal range of motion. Neck supple. No JVD present.  Cardiovascular: Normal rate and regular rhythm. Exam reveals no gallop and no friction rub.  No murmur heard. Pulmonary/Chest: Effort normal and breath sounds normal. No stridor. No respiratory distress. She has no wheezes. She has no rales. She exhibits no tenderness.  Abdominal: Soft. Bowel sounds are normal. There is tenderness. There is no rebound and no guarding.  Epigastric and right upper quadrant tenderness to palpation.  No rebound or guarding.  Musculoskeletal: Normal range of motion. She exhibits no edema or tenderness.  No midline thoracic lumbar tenderness.  No CVA tenderness.  Lymphadenopathy:    She has no cervical adenopathy.  Neurological: She is alert and oriented to person, place, and time.  Moving all extremities without focal deficit.  Sensation intact.  Skin: Skin is warm and dry. No rash noted. She is not diaphoretic. No erythema.  Psychiatric: Her behavior is normal.  Nursing note and vitals reviewed.    ED Treatments / Results  Labs (all labs ordered are listed, but  only abnormal results are displayed) Labs Reviewed  COMPREHENSIVE METABOLIC PANEL - Abnormal; Notable for the following components:      Result Value   Glucose, Bld 197 (*)    Total Protein 8.3 (*)    AST 49 (*)    ALT 51 (*)    Anion gap 16 (*)    All other components within normal limits  CBC - Abnormal; Notable for the following components:   WBC 16.2 (*)    All other components within normal limits  URINALYSIS, ROUTINE W REFLEX MICROSCOPIC - Abnormal; Notable for the following components:   Color, Urine AMBER (*)    APPearance CLOUDY (*)    Glucose, UA 50 (*)    Bilirubin Urine SMALL (*)    Ketones, ur 5 (*)    Protein, ur 100 (*)    All other components within normal limits  LIPASE, BLOOD  PREGNANCY, URINE  EKG None  Radiology Ct Abdomen Pelvis W Contrast  Result Date: 09/22/2018 CLINICAL DATA:  Mid to upper abdominal pain. EXAM: CT ABDOMEN AND PELVIS WITH CONTRAST TECHNIQUE: Multidetector CT imaging of the abdomen and pelvis was performed using the standard protocol following bolus administration of intravenous contrast. CONTRAST:  120mL ISOVUE-300 IOPAMIDOL (ISOVUE-300) INJECTION 61% COMPARISON:  None. FINDINGS: Lower chest: Lung bases are clear. No effusions. Heart is normal size. Hepatobiliary: Fatty infiltration of the liver. No focal abnormality or biliary ductal dilatation. Gallbladder unremarkable. Pancreas: No focal abnormality or ductal dilatation. Spleen: No focal abnormality.  Normal size. Adrenals/Urinary Tract: No adrenal abnormality. No focal renal abnormality. No stones or hydronephrosis. Urinary bladder is unremarkable. Stomach/Bowel: Appendix is normal. Scattered sigmoid and descending colonic diverticulosis. There is an area of abnormal wall thickening within the mid sigmoid colon. No real surrounding inflammatory change. While this could be related to diverticulitis, the appearance is concerning for possible sigmoid colon lesion/malignancy. No evidence of  bowel obstruction. Stomach and small bowel decompressed, grossly unremarkable. Vascular/Lymphatic: No evidence of aneurysm or adenopathy. Reproductive: Uterus and adnexa unremarkable. No mass. 2 cm cyst or follicle in the left ovary. Other: No free fluid or free air. Musculoskeletal: No acute bony abnormality. Sclerotic area within the right sacrum, likely bone island. IMPRESSION: Descending colonic and sigmoid diverticulosis. There is area of irregular wall thickening along the lateral wall of the mid sigmoid colon. While this could be related to diverticulitis, there are no significant surrounding inflammatory changes. Appearance is concerning for possible tumor/neoplasm. Recommend further evaluation with colonoscopy. Mild fatty infiltration of the liver. Electronically Signed   By: Rolm Baptise M.D.   On: 09/22/2018 19:18   US Abdomen Limited  Result Date: 09/22/2018 CLINICAL DATA:  Upper abdominal pain for 1 day with nausea, vomiting, and diarrhea EXAM: ULTRASOUND ABDOMEN LIMITED RIGHT UPPER QUADRANT COMPARISON:  None FINDINGS: Gallbladder: Normally distended without stones or wall thickening. No pericholecystic fluid or sonographic Murphy sign. Common bile duct: Diameter: 3 mm diameter, normal Liver: Normal appearance. No focal hepatic mass or nodularity. Portal vein is patent on color Doppler imaging with normal direction of blood flow towards the liver. No RIGHT upper quadrant free fluid. IMPRESSION: Normal exam. Electronically Signed   By: Lavonia Dana M.D.   On: 09/22/2018 15:53    Procedures Procedures (including critical care time)  Medications Ordered in ED Medications  ondansetron (ZOFRAN-ODT) disintegrating tablet 4 mg (4 mg Oral Refused 09/22/18 1512)  morphine 4 MG/ML injection 4 mg (4 mg Intravenous Given 09/22/18 1534)  sodium chloride 0.9 % bolus 1,000 mL (0 mLs Intravenous Stopped 09/22/18 1650)  iopamidol (ISOVUE-300) 61 % injection 100 mL (100 mLs Intravenous Contrast Given  09/22/18 1819)  morphine 4 MG/ML injection 4 mg (4 mg Intravenous Given 09/22/18 1950)  ciprofloxacin (CIPRO) IVPB 400 mg (0 mg Intravenous Stopped 09/22/18 2153)  metroNIDAZOLE (FLAGYL) IVPB 500 mg (0 mg Intravenous Stopped 09/22/18 2021)     Initial Impression / Assessment and Plan / ED Course  I have reviewed the triage vital signs and the nursing notes.  Pertinent labs & imaging results that were available during my care of the patient were reviewed by me and considered in my medical decision making (see chart for details).     Abdominal ultrasound without acute findings.  Patient does have mild elevation in white blood cell count and liver function test.  CT abdomen and pelvis with wall thickening of the sigmoid colon.  Question infection versus mass.  Given IV  Cipro and Flagyl.  Have encouraged patient to follow-up closely with gastroenterology.  May need colonoscopy.  Return precautions given.  Final Clinical Impressions(s) / ED Diagnoses   Final diagnoses:  Colitis    ED Discharge Orders         Ordered    ciprofloxacin (CIPRO) 500 MG tablet  2 times daily     09/22/18 2159    metroNIDAZOLE (FLAGYL) 500 MG tablet  2 times daily     09/22/18 2159    ondansetron (ZOFRAN) 4 MG tablet  Every 6 hours PRN     09/22/18 2159    traMADol (ULTRAM) 50 MG tablet  Every 6 hours PRN     09/22/18 2159           Julianne Rice, MD 09/22/18 2200

## 2018-09-22 NOTE — ED Notes (Signed)
Out of bed to BR Ambulates erect, facial features relaxed  Awaiting CT

## 2018-09-22 NOTE — ED Notes (Signed)
Pt reports feeling better   sipping contrast

## 2018-09-22 NOTE — ED Notes (Signed)
Hourly rounding Pt and SO on stretcher together with lights dimmed Pt is NAD

## 2018-09-22 NOTE — ED Notes (Signed)
Pt vitals completed, SO in bed with pt when room entered.

## 2018-09-22 NOTE — ED Notes (Signed)
Upon entering room, pt and SO in bed together with lights off  Pt in NAD

## 2018-09-22 NOTE — ED Notes (Signed)
Pt reports last 12 hours with N/V abd pain   She reports IBS (not listed in hx) and inability to find a GI specilist  She ambulates erect without guarding  To BR for urine spec

## 2018-10-01 ENCOUNTER — Ambulatory Visit: Payer: BLUE CROSS/BLUE SHIELD | Admitting: Physician Assistant

## 2018-10-01 ENCOUNTER — Encounter: Payer: Self-pay | Admitting: Physician Assistant

## 2018-10-01 ENCOUNTER — Other Ambulatory Visit (INDEPENDENT_AMBULATORY_CARE_PROVIDER_SITE_OTHER): Payer: BLUE CROSS/BLUE SHIELD

## 2018-10-01 VITALS — BP 118/70 | HR 113 | Ht 64.0 in | Wt 118.1 lb

## 2018-10-01 DIAGNOSIS — R112 Nausea with vomiting, unspecified: Secondary | ICD-10-CM | POA: Diagnosis not present

## 2018-10-01 DIAGNOSIS — R9389 Abnormal findings on diagnostic imaging of other specified body structures: Secondary | ICD-10-CM

## 2018-10-01 DIAGNOSIS — R1084 Generalized abdominal pain: Secondary | ICD-10-CM

## 2018-10-01 DIAGNOSIS — R197 Diarrhea, unspecified: Secondary | ICD-10-CM

## 2018-10-01 LAB — CBC WITH DIFFERENTIAL/PLATELET
Basophils Absolute: 0 10*3/uL (ref 0.0–0.1)
Basophils Relative: 0.3 % (ref 0.0–3.0)
Eosinophils Absolute: 0.1 10*3/uL (ref 0.0–0.7)
Eosinophils Relative: 0.7 % (ref 0.0–5.0)
HCT: 40 % (ref 36.0–46.0)
Hemoglobin: 13.7 g/dL (ref 12.0–15.0)
Lymphocytes Relative: 28.5 % (ref 12.0–46.0)
Lymphs Abs: 2.2 10*3/uL (ref 0.7–4.0)
MCHC: 34.3 g/dL (ref 30.0–36.0)
MCV: 97.8 fl (ref 78.0–100.0)
Monocytes Absolute: 0.8 10*3/uL (ref 0.1–1.0)
Monocytes Relative: 9.8 % (ref 3.0–12.0)
Neutro Abs: 4.7 10*3/uL (ref 1.4–7.7)
Neutrophils Relative %: 60.7 % (ref 43.0–77.0)
Platelets: 200 10*3/uL (ref 150.0–400.0)
RBC: 4.09 Mil/uL (ref 3.87–5.11)
RDW: 12.9 % (ref 11.5–15.5)
WBC: 7.7 10*3/uL (ref 4.0–10.5)

## 2018-10-01 LAB — BASIC METABOLIC PANEL
BUN: 5 mg/dL — ABNORMAL LOW (ref 6–23)
CO2: 27 mEq/L (ref 19–32)
Calcium: 9 mg/dL (ref 8.4–10.5)
Chloride: 105 mEq/L (ref 96–112)
Creatinine, Ser: 0.68 mg/dL (ref 0.40–1.20)
GFR: 102.16 mL/min (ref >60.00)
Glucose, Bld: 98 mg/dL (ref 70–99)
Potassium: 4.1 mEq/L (ref 3.5–5.1)
Sodium: 138 mEq/L (ref 135–145)

## 2018-10-01 LAB — SEDIMENTATION RATE: Sed Rate: 16 mm/hr (ref 0–20)

## 2018-10-01 LAB — IGA: IgA: 262 mg/dL (ref 68–378)

## 2018-10-01 LAB — HIGH SENSITIVITY CRP: CRP, High Sensitivity: 1.37 mg/L (ref 0.000–5.000)

## 2018-10-01 MED ORDER — NA SULFATE-K SULFATE-MG SULF 17.5-3.13-1.6 GM/177ML PO SOLN: ORAL | 0 refills | Status: DC

## 2018-10-01 NOTE — Progress Notes (Signed)
Subjective:    Patient ID: Victoria Buckley, female    DOB: 18-Dec-1980, 37 y.o.   MRN: [id number]  HPI Victoria Buckley is a pleasant 37 year old white female, new to GI today referred by [facility] family practice for evaluation of persistent diarrhea, and recent abnormal CT scan. Patient is generally healthy with history of migraines and anxiety.  She has not had any prior GI evaluation. She says her current symptoms have been going on over the past couple of years with episodes of diarrhea sometimes associated with nausea and vomiting usually occurring in the middle of the night if she had consumed very heavy or greasy foods.  She has been having several bowel movements per day over the past 2 years which she describes as loose. Now over the past 6 months she is having overt diarrhea with liquid stools on a daily basis and at least 6 bowel movements every day sometimes more.  He will having occasional episodes of vomiting that are sporadic.  She has not noticed any melena or hematochezia.  She says her abdomen always feels unsettled, and sometimes this is worse around the time of her menstrual cycle.  She sometimes gets mucus in her stool around that time as well.  She says she has not been feeling well in general has been eating very bland and sometimes feels better after she vomits.  She has also had dizziness off and on.  She has been worked up by Neuro with no pertinent findings.  Her weight has been stable. CT of the abdomen and pelvis was done on 09/22/2018 showing a fatty liver, normal-appearing gallbladder and appendix.  There was scattered left-sided diverticulosis and abnormal wall thickening in the mid sigmoid with no real surrounding inflammatory changes.  Could not rule out sigmoid lesion or malignancy.  Exline Labs done at that same time with glucose of 197, she also had ketones in her urine pregnancy test was negative.  WBC of 16.1, hemoglobin 14.9, AST 49/ALT of 51. She has been  on a course of Cipro and Flagyl and has not noticed any change in her symptoms. Family history is negative for GI disease.  Review of Systems Pertinent positive and negative review of systems were noted in the above HPI section.  All other review of systems was otherwise negative.  Outpatient Encounter Medications as of 10/01/2018  Medication Sig  . Butalbital-APAP-Caffeine 50-300-40 MG CAPS Take 1 tablet by mouth 3 (three) times daily as needed (for migraines).   . ciprofloxacin (CIPRO) 500 MG tablet Take 1 tablet (500 mg total) by mouth 2 (two) times daily. One po bid x 7 days  . doxepin (SINEQUAN) 25 MG capsule Take 25 mg by mouth at bedtime as needed (for sleep).   . metroNIDAZOLE (FLAGYL) 500 MG tablet Take 1 tablet (500 mg total) by mouth 2 (two) times daily. One po bid x 7 days  . ondansetron (ZOFRAN) 4 MG tablet Take 1 tablet (4 mg total) by mouth every 6 (six) hours as needed for nausea or vomiting.  . sertraline (ZOLOFT) 50 MG tablet Take 1 tablet (50 mg total) by mouth daily.  . Vitamin D, Ergocalciferol, (DRISDOL) 50000 units CAPS capsule Take 50,000 Units by mouth every Friday.   . [DISCONTINUED] ondansetron (ZOFRAN-ODT) 4 MG disintegrating tablet Take 4 mg by mouth 4 (four) times daily as needed for nausea or vomiting.   . Na Sulfate-K Sulfate-Mg Sulf 17.5-3.13-1.6 GM/177ML SOLN Take as directed for colonoscopy prep.  . [DISCONTINUED] traMADol Veatrice Bourbon)  50 MG tablet Take 1 tablet (50 mg total) by mouth every 6 (six) hours as needed for severe pain.   No facility-administered encounter medications on file as of 10/01/2018.    Allergies  Allergen Reactions  . Citalopram Hives, Itching, Nausea Only and Shortness Of Breath   Patient Active Problem List   Diagnosis Date Noted  . Allergic rhinitis 06/17/2016  . Family planning 06/17/2016  . Clinical depression 06/17/2016  . 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 Fatigue 06/17/2016  . Benign melanoma 06/17/2016  . Nonspecific  reaction to tuberculin skin test 06/17/2016  . Ear drum perforation 06/17/2016  . Avitaminosis D 06/17/2016  . Anxiety disorder 08/25/2009  . Candidal vulvovaginitis 06/01/2009  . Tobacco use 02/24/2008  . Intractable migraine without aura with status migrainosus 06/27/2006   Social History   Socioeconomic History  . Marital status: Single    Spouse name: Not on file  . Number of children: Not on file  . Years of education: Not on file  . Highest education level: Not on file  Occupational History  . Occupation: CMA  Social Needs  . Financial resource strain: Not on file  . Food insecurity:    Worry: Not on file    Inability: Not on file  . Transportation needs:    Medical: Not on file    Non-medical: Not on file  Tobacco Use  . Smoking status: Never Smoker  . Smokeless tobacco: Never Used  Substance and Sexual Activity  . Alcohol use: Yes    Alcohol/week: 0.0 standard drinks    Comment: 2-3 vodka drinks/day  . Drug use: Yes    Types: Marijuana    Comment: last use yesterday, uses it to eat  . Sexual activity: Not on file  Lifestyle  . Physical activity:    Days per week: Not on file    Minutes per session: Not on file  . Stress: Not on file  Relationships  . Social connections:    Talks on phone: Not on file    Gets together: Not on file    Attends religious service: Not on file    Active member of club or organization: Not on file    Attends meetings of clubs or organizations: Not on file    Relationship status: Not on file  . Intimate partner violence:    Fear of current or ex partner: Not on file    Emotionally abused: Not on file    Physically abused: Not on file    Forced sexual activity: Not on file  Other Topics Concern  . Not on file  Social History Narrative  . Not on file    Ms. Sweeny's family history includes ADD / ADHD in her mother; Dementia in her maternal grandfather; Osteoporosis in her mother; Scoliosis in her mother; Stroke in her maternal  grandmother.      Objective:    Vitals:   10/01/18 0925  BP: 118/70  Pulse: (!) 113    Physical Exam; well-developed young white female in no acute distress, pleasant blood pressure 118/70 pulse 100, BMI 20.2.  HEENT; nontraumatic normocephalic EOMI PERRLA sclera anicteric oral mucosa moist, Cardiovascular ;regular rate and rhythm with S1-S2 no murmur rub or gallop, Pulmonary ;clear bilaterally, Abdomen; soft, she is tender across the upper abdomen and in the left mid and lower quadrant no guarding or rebound no palpable mass or hepatosplenomegaly, sounds present.  Rectal ;exam not done, Extremities ;no clubbing cyanosis or edema skin warm and dry, Neuro psych ;  alert and oriented, grossly nonfocal mood and affect appropriate       Assessment & Plan:   #13 37 year old white female with 1 to 2-year history of increase in stool frequency, and periodic episodes of diarrhea nausea and vomiting.  Now with 26-monthhistory of persistent diarrhea with at least 6 bowel movements per day, generalized abdominal discomfort intermittent nausea CT scan has shown a fatty liver, left-sided diverticulosis and abnormal wall thickening in the mid sigmoid without adjacent inflammatory changes raising question of lesion/malignancy.  Patient symptoms are worrisome for IBD.  #2 mild transaminitis #3 hyperglycemia-rule out new onset diabetes #4 migraine headaches  Plan; Patient will be scheduled for colonoscopy with Dr. BTarri Glenn  Procedure was discussed in detail with patient including indications risks and benefits and she is agreeable to proceed. Will check sed rate, CRP, TTG and IgA and repeat CBC and be met. Start trial of Levsin sublingual 1 every morning and then every 6 hours as needed. Patient may eventually need further evaluation of her liver with follow-up labs etc.  AAlfredia FergusonPA-C 10/01/2018   Cc: CMargo Common PA

## 2018-10-01 NOTE — Patient Instructions (Addendum)
Your provider has requested that you go to the basement level for lab work before leaving today. Press "B" on the elevator. The lab is located at the first door on the left as you exit the elevator.  We sent prescriptions to your pharmacy.[place]  1. Suprep for the colonoscopy 2. Levsin SL tablets  You have been scheduled for a colonoscopy. Please follow written instructions given to you at your visit today.  Please pick up your prep supplies at the pharmacy within the next 1-3 days. If you use inhalers (even only as needed), please bring them with you on the day of your procedure.  Normal BMI (Body Mass Index- based on height and weight) is between 19 and 25. Your BMI today is Body mass index is 20.27 kg/m. Marland Kitchen Please consider follow up  regarding your BMI with your Primary Care Provider.

## 2018-10-02 NOTE — Progress Notes (Signed)
Reviewed. I agree with documentation including the assessment and plan.  Zyiere Rosemond L. Evelyn Moch, MD, MPH 

## 2018-10-05 ENCOUNTER — Other Ambulatory Visit: Payer: Self-pay

## 2018-10-05 DIAGNOSIS — R1084 Generalized abdominal pain: Secondary | ICD-10-CM

## 2018-10-05 LAB — TISSUE TRANSGLUTAMINASE, IGG: (tTG) Ab, IgG: 3 U/mL

## 2018-10-05 MED ORDER — HYOSCYAMINE SULFATE 0.125 MG SL SUBL: SUBLINGUAL_TABLET | SUBLINGUAL | 0 refills | Status: DC

## 2018-10-16 ENCOUNTER — Ambulatory Visit: Payer: Self-pay | Admitting: Gastroenterology

## 2018-10-22 ENCOUNTER — Encounter: Payer: Self-pay | Admitting: Gastroenterology

## 2018-10-22 ENCOUNTER — Other Ambulatory Visit: Payer: Self-pay | Admitting: Physician Assistant

## 2018-10-22 ENCOUNTER — Ambulatory Visit (AMBULATORY_SURGERY_CENTER): Payer: BLUE CROSS/BLUE SHIELD | Admitting: Gastroenterology

## 2018-10-22 VITALS — BP 112/74 | HR 75 | Temp 98.6°F | Resp 25

## 2018-10-22 DIAGNOSIS — R197 Diarrhea, unspecified: Secondary | ICD-10-CM

## 2018-10-22 DIAGNOSIS — R933 Abnormal findings on diagnostic imaging of other parts of digestive tract: Secondary | ICD-10-CM | POA: Diagnosis not present

## 2018-10-22 MED ORDER — SODIUM CHLORIDE 0.9 % IV SOLN: 500.0000 mL | Freq: Once | INTRAVENOUS | Status: DC

## 2018-10-22 NOTE — Progress Notes (Signed)
Report given to PACU, vss 

## 2018-10-22 NOTE — Patient Instructions (Signed)
YOU HAD AN ENDOSCOPIC PROCEDURE TODAY AT THE [facility]:   Refer to the procedure report that was given to you for any specific questions about what was found during the examination.  If the procedure report does not answer your questions, please call your gastroenterologist to clarify.  If you requested that your care partner not be given the details of your procedure findings, then the procedure report has been included in a sealed envelope for you to review at your convenience later.  YOU SHOULD EXPECT: Some feelings of bloating in the abdomen. Passage of more gas than usual.  Walking can help get rid of the air that was put into your GI tract during the procedure and reduce the bloating. If you had a lower endoscopy (such as a colonoscopy or flexible sigmoidoscopy) you may notice spotting of blood in your stool or on the toilet paper. If you underwent a bowel prep for your procedure, you may not have a normal bowel movement for a few days.  Please Note:  You might notice some irritation and congestion in your nose or some drainage.  This is from the oxygen used during your procedure.  There is no need for concern and it should clear up in a day or so.  SYMPTOMS TO REPORT IMMEDIATELY:   Following lower endoscopy (colonoscopy or flexible sigmoidoscopy):  Excessive amounts of blood in the stool  Significant tenderness or worsening of abdominal pains  Swelling of the abdomen that is new, acute  Fever of 100F or higher  For urgent or emergent issues, a gastroenterologist can be reached at any hour by calling [phone].   DIET:  We do recommend a small meal at first, but then you may proceed to your regular diet.  Drink plenty of fluids but you should avoid alcoholic beverages for 24 hours.  ACTIVITY:  You should plan to take it easy for the rest of today and you should NOT DRIVE or use heavy machinery until tomorrow (because of the sedation medicines used during the test).     FOLLOW UP: Our staff will call the number listed on your records the next business day following your procedure to check on you and address any questions or concerns that you may have regarding the information given to you following your procedure. If we do not reach you, we will leave a message.  However, if you are feeling well and you are not experiencing any problems, there is no need to return our call.  We will assume that you have returned to your regular daily activities without incident.  If any biopsies were taken you will be contacted by phone or by letter within the next 1-3 weeks.  Please call us at [phone] if you have not heard about the biopsies in 3 weeks.    SIGNATURES/CONFIDENTIALITY: You and/or your care partner have signed paperwork which will be entered into your electronic medical record.  These signatures attest to the fact that that the information above on your After Visit Summary has been reviewed and is understood.  Full responsibility of the confidentiality of this discharge information lies with you and/or your care-partner. 

## 2018-10-22 NOTE — Progress Notes (Signed)
Pt's states no medical or surgical changes since previsit or office visit. 

## 2018-10-22 NOTE — Op Note (Addendum)
[facility] Patient Name: Victoria Buckley Procedure Date: 10/22/2018 3:05 PM MRN: [id number] Endoscopist: Thornton Park MD, MD Age: 37 Referring MD:  Date of Birth: [birth year] Gender: Female Account #: [id number] Procedure:                Colonoscopy Indications:              Chronic diarrhea. Abnormal CT scan. Please see the                            recent office note for complete details. No known                            family history of colon cancer or polyps. Medicines:                See the Anesthesia note for documentation of the                            administered medications Procedure:                Pre-Anesthesia Assessment:                           - Prior to the procedure, a History and Physical                            was performed, and patient medications and                            allergies were reviewed. The patient's tolerance of                            previous anesthesia was also reviewed. The risks                            and benefits of the procedure and the sedation                            options and risks were discussed with the patient.                            All questions were answered, and informed consent                            was obtained. Prior Anticoagulants: The patient has                            taken no previous anticoagulant or antiplatelet                            agents. ASA Grade Assessment: I - A normal, healthy                            patient. After reviewing the risks and benefits,  the patient was deemed in satisfactory condition to                            undergo the procedure.                           After obtaining informed consent, the colonoscope                            was passed under direct vision. Throughout the                            procedure, the patient's blood pressure, pulse, and                            oxygen saturations were  monitored continuously. The                            Colonoscope was introduced through the anus and                            advanced to the the terminal ileum, with                            identification of the appendiceal orifice and IC                            valve. The colonoscopy was performed with moderate                            difficulty due to restricted mobility of the colon.                            The patient tolerated the procedure well. The                            quality of the bowel preparation was excellent. Scope In: 3:09:27 PM Scope Out: 3:19:41 PM Scope Withdrawal Time: 0 hours 6 minutes 31 seconds  Total Procedure Duration: 0 hours 10 minutes 14 seconds  Findings:                 The perianal and digital rectal examinations were                            normal.                           A few small and large-mouthed diverticula were                            found in the sigmoid colon, descending colon and                            ascending colon.  The colon (entire examined portion) appeared                            normal. Biopsies were taken with a cold forceps for                            histology. The mucosal abnormality seen in the                            sigmoid.                           The exam was otherwise without abnormality on                            direct and retroflexion views. The rectosigmoid                            junction is fixed. Complications:            No immediate complications. Estimated Blood Loss:     Estimated blood loss: none. Impression:               - Diverticulosis in the sigmoid colon, in the                            descending colon and in the ascending colon.                           - The entire examined colon is normal. Biopsied.                           - The examination was otherwise normal on direct                            and retroflexion views.                            - No obvious source for diarrhea identified on this                            examination. Consider segmental colitis of                            diverticulosis given her CT scan results if                            symptoms persist. Recommendation:           - Discharge patient to home.                           - Resume previous diet today. High fiber diet                            recommended.                           -  Continue present medications.                           - Await pathology results.                           - Return to GI office ([facility] or [place]) in 2                            weeks. Thornton Park MD, MD 10/22/2018 3:29:39 PM This report has been signed electronically.

## 2018-10-22 NOTE — Progress Notes (Signed)
Called to room to assist during endoscopic procedure.  Patient ID and intended procedure confirmed with present staff. Received instructions for my participation in the procedure from the performing physician.  

## 2018-10-23 ENCOUNTER — Telehealth: Payer: Self-pay

## 2018-10-23 NOTE — Telephone Encounter (Signed)
Follow up call x 2, left a vice message.

## 2018-10-23 NOTE — Telephone Encounter (Signed)
Attempted to reach pt. With follow-up call following endoscopic procedure 10/22/2018.  LM on pt. Voice mail.  Will try to reach pt. Again later today.

## 2018-11-01 ENCOUNTER — Encounter: Payer: Self-pay | Admitting: Gastroenterology

## 2018-11-03 ENCOUNTER — Telehealth: Payer: Self-pay

## 2018-11-03 NOTE — Telephone Encounter (Signed)
Called and left a message for patient to call back to schedule a follow up appt post colonoscopy with Dr. Tarri Glenn;

## 2018-11-06 NOTE — Telephone Encounter (Signed)
Left a message for the patient to call back to the office and set up a follow up appt;

## 2018-11-06 NOTE — Telephone Encounter (Signed)
Called and left a message for patient to return a call to the office and set up a follow up appt post colonoscopy;

## 2018-11-06 NOTE — Telephone Encounter (Signed)
Thank you for your efforts. We will await her call to schedule.

## 2019-04-01 IMAGING — CT CT ABD-PELV W/ CM
2 of 4 series · 15 of 46 positions shown, 17 images · IV contrast (Isovue)
Comparison: None.

CLINICAL DATA: Mid to upper abdominal pain.

EXAM:
CT ABDOMEN AND PELVIS WITH CONTRAST
TECHNIQUE: Multidetector CT imaging of the abdomen and pelvis was performed
using the standard protocol following bolus administration of
intravenous contrast.
CONTRAST:  100mL GZ9VM6-MRR IOPAMIDOL (GZ9VM6-MRR) INJECTION 61%

[Series 2: axial st · axial · 0.63mm/px · z∈[+408,+768]mm · 12 of 83 slices shown, 14 images]
[im 7/83  soft-tissue]
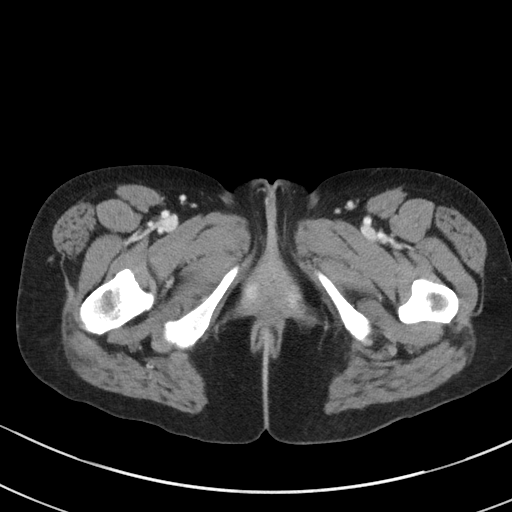
[im 7/83  bone]
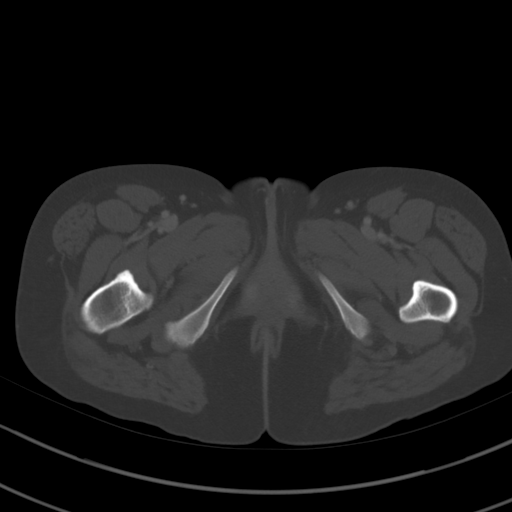
[im 14/83  soft-tissue]
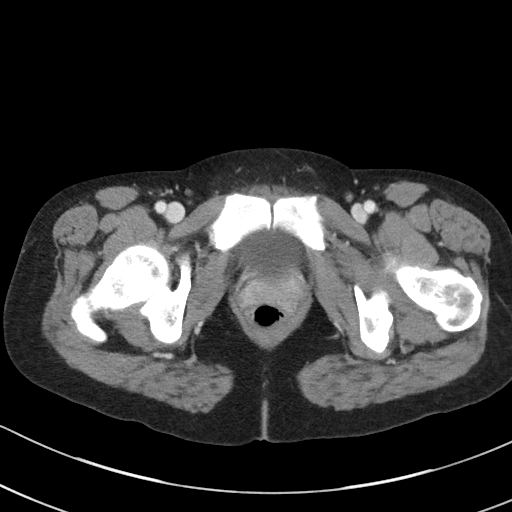
[im 20/83  soft-tissue]
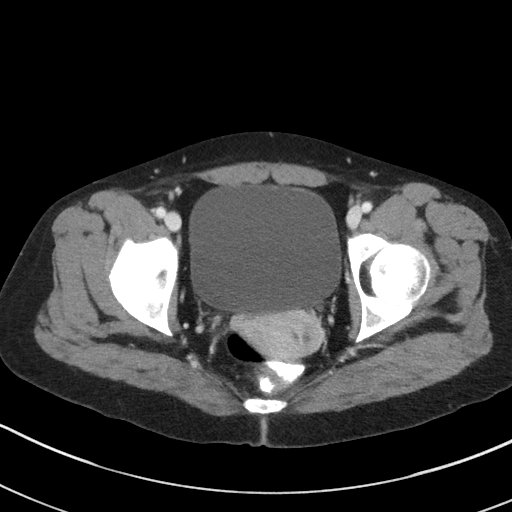
[im 27/83  soft-tissue]
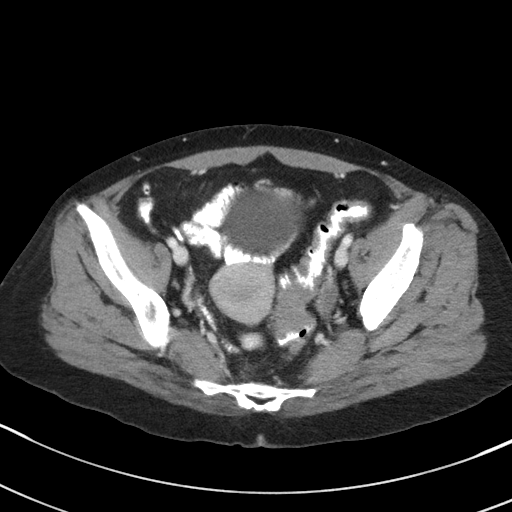
[im 33/83  soft-tissue]
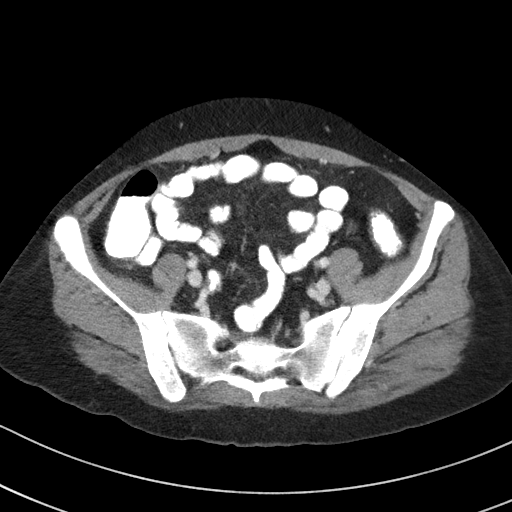
[im 40/83  soft-tissue]
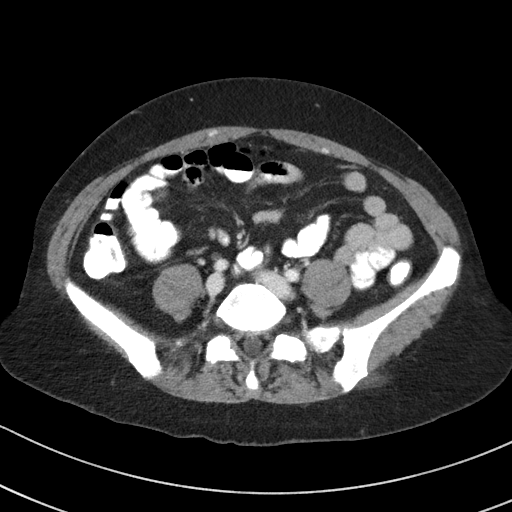
[im 46/83  soft-tissue]
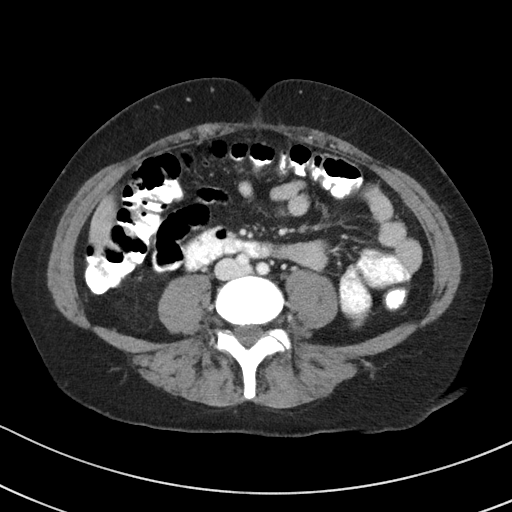
[im 53/83  soft-tissue]
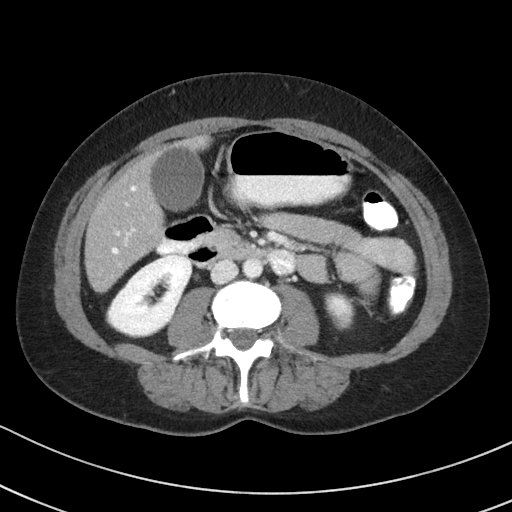
[im 60/83  soft-tissue]
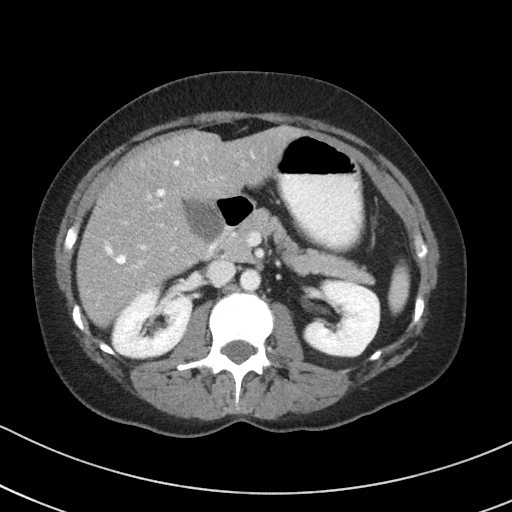
[im 60/83  bone]
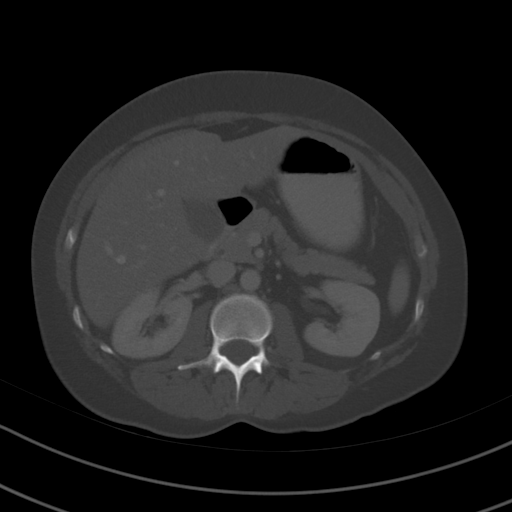
[im 66/83  soft-tissue]
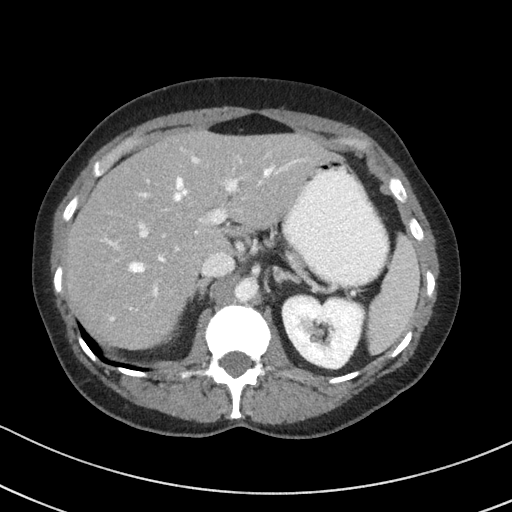
[im 73/83  soft-tissue]
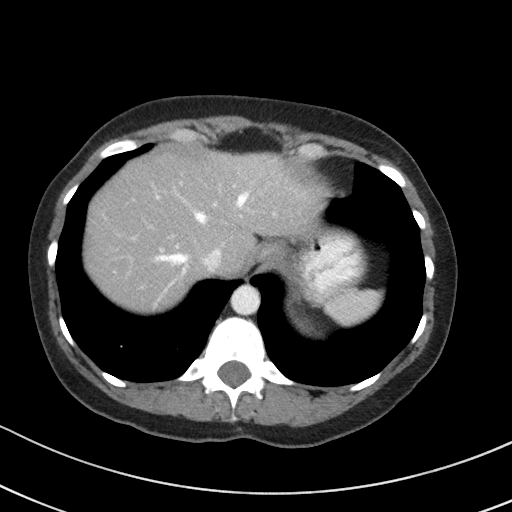
[im 79/83  soft-tissue]
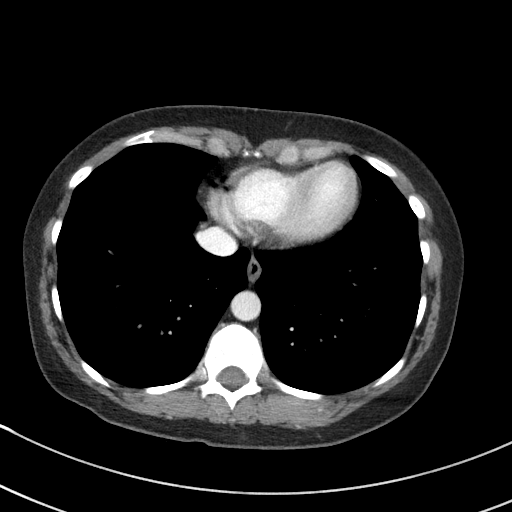

[Series 5: coronal st · coronal · 0.56mm/px · 3 of 76 slices shown]
[im 26/76  soft-tissue]
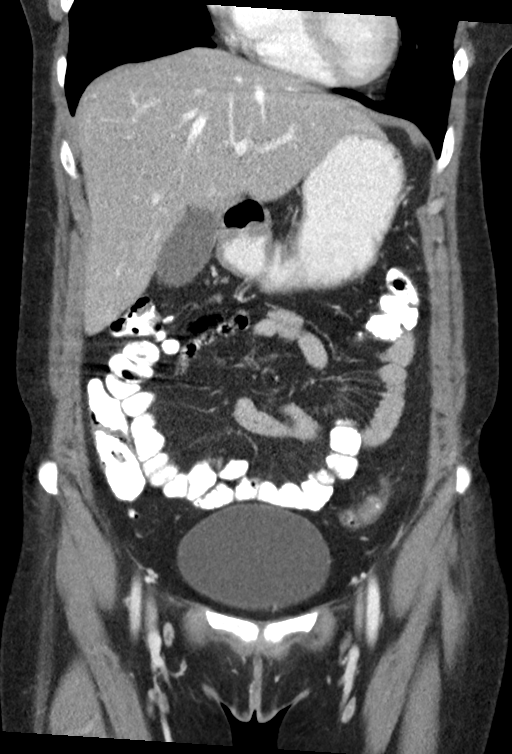
[im 34/76  soft-tissue]
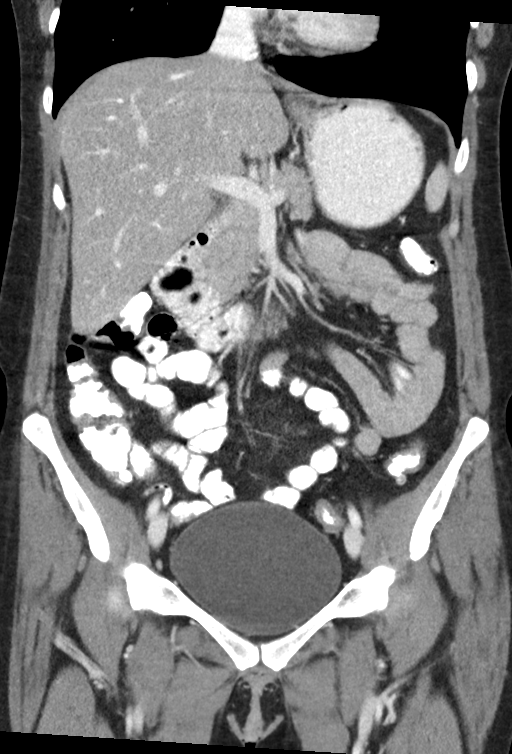
[im 42/76  soft-tissue]
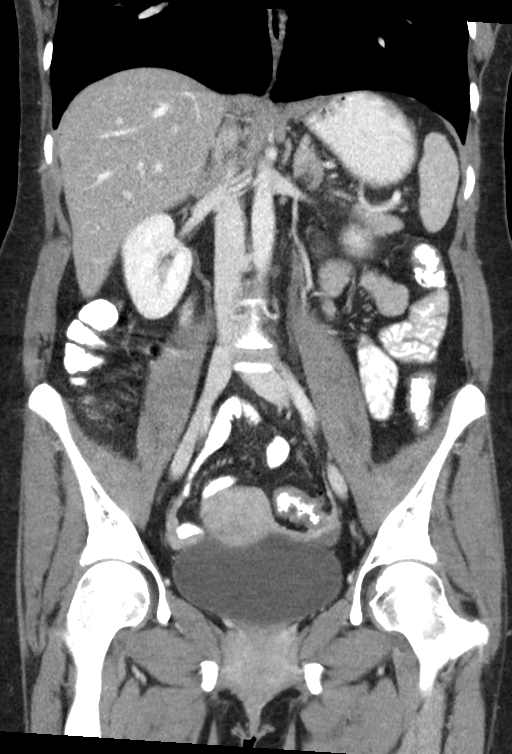

[15 of 46 positions shown; findings below may reference images not displayed]

FINDINGS: Lower chest: Lung bases are clear. No effusions. Heart is normal
size.

Hepatobiliary: Fatty infiltration of the liver. No focal abnormality
or biliary ductal dilatation. Gallbladder unremarkable.

Pancreas: No focal abnormality or ductal dilatation.

Spleen: No focal abnormality.  Normal size.

Adrenals/Urinary Tract: No adrenal abnormality. No focal renal
abnormality. No stones or hydronephrosis. Urinary bladder is
unremarkable.

Stomach/Bowel: Appendix is normal. Scattered sigmoid and descending
colonic diverticulosis. There is an area of abnormal wall thickening
within the mid sigmoid colon. No real surrounding inflammatory
change. While this could be related to diverticulitis, the
appearance is concerning for possible sigmoid colon
lesion/malignancy. No evidence of bowel obstruction. Stomach and
small bowel decompressed, grossly unremarkable.

Vascular/Lymphatic: No evidence of aneurysm or adenopathy.

Reproductive: Uterus and adnexa unremarkable. No mass. 2 cm cyst or
follicle in the left ovary.

Other: No free fluid or free air.

Musculoskeletal: No acute bony abnormality. Sclerotic area within
the right sacrum, likely bone island.
IMPRESSION: Descending colonic and sigmoid diverticulosis. There is area of
irregular wall thickening along the lateral wall of the mid sigmoid
colon. While this could be related to diverticulitis, there are no
significant surrounding inflammatory changes. Appearance is
concerning for possible tumor/neoplasm. Recommend further evaluation
with colonoscopy.

Mild fatty infiltration of the liver.

## 2019-10-04 ENCOUNTER — Other Ambulatory Visit: Payer: Self-pay

## 2019-10-05 ENCOUNTER — Other Ambulatory Visit: Payer: Self-pay

## 2019-10-05 ENCOUNTER — Ambulatory Visit (INDEPENDENT_AMBULATORY_CARE_PROVIDER_SITE_OTHER): Payer: BC Managed Care – PPO | Admitting: Internal Medicine

## 2019-10-05 ENCOUNTER — Encounter: Payer: Self-pay | Admitting: Internal Medicine

## 2019-10-05 VITALS — BP 140/70 | HR 79 | Temp 98.2°F | Ht 64.0 in | Wt 119.0 lb

## 2019-10-05 DIAGNOSIS — Z1231 Encounter for screening mammogram for malignant neoplasm of breast: Secondary | ICD-10-CM

## 2019-10-05 DIAGNOSIS — R110 Nausea: Secondary | ICD-10-CM | POA: Diagnosis not present

## 2019-10-05 DIAGNOSIS — R197 Diarrhea, unspecified: Secondary | ICD-10-CM

## 2019-10-05 DIAGNOSIS — G43909 Migraine, unspecified, not intractable, without status migrainosus: Secondary | ICD-10-CM

## 2019-10-05 DIAGNOSIS — F329 Major depressive disorder, single episode, unspecified: Secondary | ICD-10-CM

## 2019-10-05 DIAGNOSIS — F419 Anxiety disorder, unspecified: Secondary | ICD-10-CM | POA: Diagnosis not present

## 2019-10-05 DIAGNOSIS — Z0000 Encounter for general adult medical examination without abnormal findings: Secondary | ICD-10-CM

## 2019-10-05 DIAGNOSIS — E559 Vitamin D deficiency, unspecified: Secondary | ICD-10-CM

## 2019-10-05 DIAGNOSIS — F32A Depression, unspecified: Secondary | ICD-10-CM

## 2019-10-05 DIAGNOSIS — Z1389 Encounter for screening for other disorder: Secondary | ICD-10-CM

## 2019-10-05 DIAGNOSIS — G43011 Migraine without aura, intractable, with status migrainosus: Secondary | ICD-10-CM

## 2019-10-05 DIAGNOSIS — Z13220 Encounter for screening for lipoid disorders: Secondary | ICD-10-CM

## 2019-10-05 DIAGNOSIS — R739 Hyperglycemia, unspecified: Secondary | ICD-10-CM

## 2019-10-05 DIAGNOSIS — Z1329 Encounter for screening for other suspected endocrine disorder: Secondary | ICD-10-CM

## 2019-10-05 DIAGNOSIS — R1013 Epigastric pain: Secondary | ICD-10-CM

## 2019-10-05 DIAGNOSIS — J309 Allergic rhinitis, unspecified: Secondary | ICD-10-CM

## 2019-10-05 MED ORDER — BUPROPION HCL ER (XL) 150 MG PO TB24: 150.0000 mg | ORAL_TABLET | Freq: Every day | ORAL | 3 refills | Status: DC

## 2019-10-05 MED ORDER — ONDANSETRON HCL 4 MG PO TABS: 4.0000 mg | ORAL_TABLET | Freq: Three times a day (TID) | ORAL | 0 refills | Status: DC | PRN

## 2019-10-05 MED ORDER — BUTALBITAL-APAP-CAFFEINE 50-300-40 MG PO CAPS: 1.0000 | ORAL_CAPSULE | Freq: Two times a day (BID) | ORAL | 2 refills | Status: DC | PRN

## 2019-10-05 MED ORDER — HYDROXYZINE HCL 25 MG PO TABS: 25.0000 mg | ORAL_TABLET | Freq: Two times a day (BID) | ORAL | 2 refills | Status: DC | PRN

## 2019-10-05 NOTE — Progress Notes (Signed)
Pre visit review using our clinic review tool, if applicable. No additional management support is needed unless otherwise documented below in the visit note. 

## 2019-10-05 NOTE — Progress Notes (Signed)
Chief Complaint  Patient presents with  . Establish Care   New patient  Anxiety and depression/insomnia sleeping around 7 hours qhs on doxepin 25-50 mg qhs. She is ocd and perfectionist  phq  9 score 13 today and GAD 7 score 16 today was on wellbutrin 150 mg qd off meds x 1 week but was helping wants refill  celexa caused hives and zoloft 50 mg qd tremor and has slight baseline tremor. Mom is on cymbalta but reviewed possible risk of tremor and pt declines to take due to possible side effect   Nausea chronic >1 year associated with diarrhea and upper quadrant mild to moderate abdominal pain.  She had CT ab/pelvis + diverticulosis vs itis colonoscopy negative for diverticulitis and negative bxs wants refill ondansetron (ZOFRAN) 4 MG tablet and never f/u with Dr. Tarri [facility] for this. US abdomen 09/22/18 normal no GB issue   Allergic rhinitis  Takes zyrtec 10 mg qd prn  Migraine without status migrainosus frequency less takes Butalbital-APAP-Caffeine 50-300-40 MG CAPS and wants refill and f/u Dr. Jaynee Eagles neurology in the past and no need for f/u f/u prn. Takes fiorcet prn only and not often but helps when taking this medication   BP sl elevated today 140/70 will monitor never had BP issues     Review of Systems  Constitutional: Negative for weight loss.  HENT: Negative for hearing loss.   Eyes: Negative for blurred vision.  Respiratory: Negative for shortness of breath.   Cardiovascular: Negative for chest pain.  Gastrointestinal: Positive for abdominal pain, diarrhea and nausea.  Musculoskeletal: Negative for falls.  Skin: Negative for rash.  Neurological: Negative for headaches.  Psychiatric/Behavioral: Positive for depression. The patient is nervous/anxious and has insomnia.    Past Medical History:  Diagnosis Date  . Allergy   . Anxiety   . Chicken pox   . Depression   . GERD (gastroesophageal reflux disease)   . Migraines   . Miscarriage    x3  . Nausea   . OCD (obsessive  compulsive disorder)   . 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 Vitamin D deficiency    Past Surgical History:  Procedure Laterality Date  . BUNIONECTOMY  1998  . DILATION AND CURETTAGE OF UTERUS  2006   Family History  Problem Relation Age of Onset  . Osteoporosis Mother   . Scoliosis Mother   . Arthritis Mother   . Depression Mother   . Hyperlipidemia Mother   . Stroke Maternal Grandmother   . Arthritis Maternal Grandmother   . Dementia Maternal Grandfather   . Heart disease Maternal Grandfather   . Hypertension Maternal Grandfather   . Hyperlipidemia Maternal Grandfather   . Hyperlipidemia Paternal Grandmother   . Kidney disease Paternal Grandmother   . Diabetes Paternal Grandmother   . Asthma Paternal Grandfather   . COPD Paternal Grandfather   . Colon cancer Neg Hx   . Esophageal cancer Neg Hx    Social History   Socioeconomic History  . Marital status: Single    Spouse name: Not on file  . Number of children: Not on file  . Years of education: Not on file  . Highest education level: Not on file  Occupational History  . Occupation: CMA  Social Needs  . Financial resource strain: Not on file  . Food insecurity    Worry: Not on file    Inability: Not on file  . Transportation needs    Medical:  Not on file    Non-medical: Not on file  Tobacco Use  . Smoking status: Never Smoker  . Smokeless tobacco: Never Used  Substance and Sexual Activity  . Alcohol use: Yes    Alcohol/week: 0.0 standard drinks    Comment: did not quantitate 10/05/2019   . Drug use: Not Currently    Types: Marijuana    Comment: prev THC per chart review as of 10/05/19 denies THC use   . Sexual activity: Yes  Lifestyle  . Physical activity    Days per week: Not on file    Minutes per session: Not on file  . Stress: Not on file  Relationships  . Social Herbalist on phone: Not on file    Gets together: Not on file    Attends religious service: Not on file    Active  member of club or organization: Not on file    Attends meetings of clubs or organizations: Not on file    Relationship status: Not on file  . Intimate partner violence    Fear of current or ex partner: Not on file    Emotionally abused: Not on file    Physically abused: Not on file    Forced sexual activity: Not on file  Other Topics Concern  . 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 Butalbital-APAP-Caffeine 50-300-40 MG CAPS Take 1-2 tablets by mouth 2 (two) times daily as needed (for migraines). Do not take more than 6 pills in 24 hours  . cetirizine (ZYRTEC) 10 MG tablet Take 10 mg by mouth daily.  . ondansetron (ZOFRAN) 4 MG tablet Take 1 tablet (4 mg total) by mouth every 8 (eight) hours as needed for nausea or vomiting.  . Vitamin D, Ergocalciferol, (DRISDOL) 50000 units CAPS capsule Take 50,000 Units by mouth every Friday.   . [DISCONTINUED] buPROPion (WELLBUTRIN XL) 150 MG 24 hr tablet Take 150 mg by mouth daily.  . [DISCONTINUED] Butalbital-APAP-Caffeine 50-300-40 MG CAPS Take 1 tablet by mouth 3 (three) times daily as needed (for migraines).   . [DISCONTINUED] doxepin (SINEQUAN) 25 MG capsule Take 25 mg by mouth at bedtime as needed (for sleep).   . [DISCONTINUED] ondansetron (ZOFRAN) 4 MG tablet Take 1 tablet (4 mg total) by mouth every 6 (six) hours as needed for nausea or vomiting.   Allergies  Allergen Reactions  . Citalopram Hives, Itching, Nausea Only and Shortness Of Breath  . Zoloft [Sertraline Hcl]     Hand tremor    No results found for this or any previous visit (from the past 2160 hour(s)). Objective  Body mass index is 20.43 kg/m. Wt Readings from Last 3 Encounters:  10/05/19 119 lb (54 kg)  10/01/18 118 lb 1 oz (53.6 kg)  09/22/18 119 lb (54 kg)   Temp  Readings from Last 3 Encounters:  10/05/19 98.2 F (36.8 C) (Skin)  10/22/18 98.6 F (37 C)  09/22/18 98.9 F (37.2 C) (Oral)   BP Readings from Last 3 Encounters:  10/05/19 140/70  10/22/18 112/74  10/01/18 118/70   Pulse Readings from Last 3 Encounters:  10/05/19 79  10/22/18 75  10/01/18 (!) 113  Physical Exam Vitals signs and nursing note reviewed.  Constitutional:      Appearance: Normal appearance. She is well-developed and well-groomed.     Comments: +mask on    HENT:     Head: Normocephalic and atraumatic.  Eyes:     Conjunctiva/sclera: Conjunctivae normal.     Pupils: Pupils are equal, round, and reactive to light.  Cardiovascular:     Rate and Rhythm: Normal rate and regular rhythm.     Heart sounds: Normal heart sounds. No murmur.  Pulmonary:     Effort: Pulmonary effort is normal.     Breath sounds: Normal breath sounds.  Abdominal:     General: Abdomen is flat. Bowel sounds are normal.     Tenderness: There is no abdominal tenderness.  Skin:    General: Skin is warm and dry.     Comments: Benign nevi to trunk    Neurological:     General: No focal deficit present.     Mental Status: She is alert and oriented to person, place, and time. Mental status is at baseline.     Gait: Gait normal.  Psychiatric:        Attention and Perception: Attention and perception normal.        Mood and Affect: Mood and affect normal.        Speech: Speech normal.        Behavior: Behavior normal. Behavior is cooperative.        Thought Content: Thought content normal.        Cognition and Memory: Cognition and memory normal.        Judgment: Judgment normal.     Assessment  Plan  Anxiety and depression  GAD 7 16 and PHQ 9 score 13 today - Plan: buPROPion (WELLBUTRIN XL) 150 MG 24 hr tablet out x 1 week resume medication was working, hydrOXYzine (ATARAX/VISTARIL) 25-50 MG tablet qhs or bid prn, stop doxepin 25-50 mg qhs due to trouble sleeping  -refer to therapy  Dr. Nunzio Cobbs in [place]. Malachy Moan office  Nausea/diarrhea/ab pain - Plan: ondansetron (ZOFRAN) 4 MG tablet -rec call Dr. Tarri [facility] and f/u   Allergic rhinitis  -zyrtec 10 mg qd prn do not take with hydroxyzine 25-50 for anxiety   Hyperglycemia - Plan: Hemoglobin A1c  Vitamin D deficiency - Plan: Vitamin D (25 hydroxy) on D3 50K IU weekly   Migraine without status migrainosus, not intractable, unspecified migraine type - Plan: Butalbital-APAP-Caffeine 50-300-40 MG CAPS  F/u neurology prn   HM Fasting labs [facility] given lab req today  Declines flu shot  Tdap 06/30/19  TBT + in 2004 CXR negative   Pap will do at f/u lmp 10/04/2019  Mammogram referred [facility]  Colonoscopy 2019 Dr. 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 Olivia Mackie McLean-Scocuz[facility]

## 2019-10-05 NOTE — Patient Instructions (Addendum)
Stress relax brand L theanine 100-200 mg daily or Tranquil sleep (serotonin, melatonin, serotonin)  Please call [facility] and make appt [phone] May need upper endoscopy to work up your symptoms    Generalized Anxiety Disorder, Adult Generalized anxiety disorder (GAD) is a mental health disorder. People with this condition constantly worry about everyday events. Unlike normal anxiety, worry related to GAD is not triggered by a specific event. These worries also do not fade or get better with time. GAD interferes with life functions, including relationships, work, and school. GAD can vary from mild to severe. People with severe GAD can have intense waves of anxiety with physical symptoms (panic attacks). What are the causes? The exact cause of GAD is not known. What increases the risk? This condition is more likely to develop in:  Women.  People who have a family history of anxiety disorders.  People who are very shy.  People who experience very stressful life events, such as the death of a loved one.  People who have a very stressful family environment. What are the signs or symptoms? People with GAD often worry excessively about many things in their lives, such as their health and family. They may also be overly concerned about:  Doing well at work.  Being on time.  Natural disasters.  Friendships. Physical symptoms of GAD include:  Fatigue.  Muscle tension or having muscle twitches.  Trembling or feeling shaky.  Being easily startled.  Feeling like your heart is pounding or racing.  Feeling out of breath or like you cannot take a deep breath.  Having trouble falling asleep or staying asleep.  Sweating.  Nausea, diarrhea, or irritable bowel syndrome (IBS).  Headaches.  Trouble concentrating or remembering facts.  Restlessness.  Irritability. How is this diagnosed? Your health care provider can diagnose GAD based on your symptoms and medical history.  You will also have a physical exam. The health care provider will ask specific questions about your symptoms, including how severe they are, when they started, and if they come and go. Your health care provider may ask you about your use of alcohol or drugs, including prescription medicines. Your health care provider may refer you to a mental health specialist for further evaluation. Your health care provider will do a thorough examination and may perform additional tests to rule out other possible causes of your symptoms. To be diagnosed with GAD, a person must have anxiety that:  Is out of his or her control.  Affects several different aspects of his or her life, such as work and relationships.  Causes distress that makes him or her unable to take part in normal activities.  Includes at least three physical symptoms of GAD, such as restlessness, fatigue, trouble concentrating, irritability, muscle tension, or sleep problems. Before your health care provider can confirm a diagnosis of GAD, these symptoms must be present more days than they are not, and they must last for six months or longer. How is this treated? The following therapies are usually used to treat GAD:  Medicine. Antidepressant medicine is usually prescribed for long-term daily control. Antianxiety medicines may be added in severe cases, especially when panic attacks occur.  Talk therapy (psychotherapy). Certain types of talk therapy can be helpful in treating GAD by providing support, education, and guidance. Options include: ? Cognitive behavioral therapy (CBT). People learn coping skills and techniques to ease their anxiety. They learn to identify unrealistic or negative thoughts and behaviors and to replace them with positive  ones. ? Acceptance and commitment therapy (ACT). This treatment teaches people how to be mindful as a way to cope with unwanted thoughts and feelings. ? Biofeedback. This process trains you to manage your  body's response (physiological response) through breathing techniques and relaxation methods. You will work with a therapist while machines are used to monitor your physical symptoms.  Stress management techniques. These include yoga, meditation, and exercise. A mental health specialist can help determine which treatment is best for you. Some people see improvement with one type of therapy. However, other people require a combination of therapies. Follow these instructions at home:  Take over-the-counter and prescription medicines only as told by your health care provider.  Try to maintain a normal routine.  Try to anticipate stressful situations and allow extra time to manage them.  Practice any stress management or self-calming techniques as taught by your health care provider.  Do not punish yourself for setbacks or for not making progress.  Try to recognize your accomplishments, even if they are small.  Keep all follow-up visits as told by your health care provider. This is important. Contact a health care provider if:  Your symptoms do not get better.  Your symptoms get worse.  You have signs of depression, such as: ? A persistently sad, cranky, or irritable mood. ? Loss of enjoyment in activities that used to bring you joy. ? Change in weight or eating. ? Changes in sleeping habits. ? Avoiding friends or family members. ? Loss of energy for normal tasks. ? Feelings of guilt or worthlessness. Get help right away if:  You have serious thoughts about hurting yourself or others. If you ever feel like you may hurt yourself or others, or have thoughts about taking your own life, get help right away. You can go to your nearest emergency department or call:  Your local emergency services (911 in the [place]  A suicide crisis helpline, such as the [facility] at 305 572 1260. This is open 24 hours a day. Summary  Generalized anxiety disorder (GAD) is  a mental health disorder that involves worry that is not triggered by a specific event.  People with GAD often worry excessively about many things in their lives, such as their health and family.  GAD may cause physical symptoms such as restlessness, trouble concentrating, sleep problems, frequent sweating, nausea, diarrhea, headaches, and trembling or muscle twitching.  A mental health specialist can help determine which treatment is best for you. Some people see improvement with one type of therapy. However, other people require a combination of therapies. This information is not intended to replace advice given to you by your health care provider. Make sure you discuss any questions you have with your health care provider. Document Released: 03/15/2013 Document Revised: 10/31/2017 Document Reviewed: 10/08/2016 Elsevier Patient Education  2020 [place]  After being diagnosed with an anxiety disorder, you may be relieved to know why you have felt or behaved a certain way. It is natural to also feel overwhelmed about the treatment ahead and what it will mean for your life. With care and support, you can manage this condition and recover from it. How to cope with anxiety Dealing with stress Stress is your body's reaction to life changes and events, both good and bad. Stress can last just a few hours or it can be ongoing. Stress can play a major role in anxiety, so it is important to learn both how to cope with stress  and how to think about it differently. Talk with your health care provider or a counselor to learn more about stress reduction. He or she may suggest some stress reduction techniques, such as:  Music therapy. This can include creating or listening to music that you enjoy and that inspires you.  Mindfulness-based meditation. This involves being aware of your normal breaths, rather than trying to control your breathing. It can be done while sitting or  walking.  Centering prayer. This is a kind of meditation that involves focusing on a word, phrase, or sacred image that is meaningful to you and that brings you peace.  Deep breathing. To do this, expand your stomach and inhale slowly through your nose. Hold your breath for 3-5 seconds. Then exhale slowly, allowing your stomach muscles to relax.  Self-talk. This is a skill where you identify thought patterns that lead to anxiety reactions and correct those thoughts.  Muscle relaxation. This involves tensing muscles then relaxing them. Choose a stress reduction technique that fits your lifestyle and personality. Stress reduction techniques take time and practice. Set aside 5-15 minutes a day to do them. Therapists can offer training in these techniques. The training may be covered by some insurance plans. Other things you can do to manage stress include:  Keeping a stress diary. This can help you learn what triggers your stress and ways to control your response.  Thinking about how you respond to certain situations. You may not be able to control everything, but you can control your reaction.  Making time for activities that help you relax, and not feeling guilty about spending your time in this way. Therapy combined with coping and stress-reduction skills provides the best chance for successful treatment. Medicines Medicines can help ease symptoms. Medicines for anxiety include:  Anti-anxiety drugs.  Antidepressants.  Beta-blockers. Medicines may be used as the main treatment for anxiety disorder, along with therapy, or if other treatments are not working. Medicines should be prescribed by a health care provider. Relationships Relationships can play a big part in helping you recover. Try to spend more time connecting with trusted friends and family members. Consider going to couples counseling, taking family education classes, or going to family therapy. Therapy can help you and others  better understand the condition. How to recognize changes in your condition Everyone has a different response to treatment for anxiety. Recovery from anxiety happens when symptoms decrease and stop interfering with your daily activities at home or work. This may mean that you will start to:  Have better concentration and focus.  Sleep better.  Be less irritable.  Have more energy.  Have improved memory. It is important to recognize when your condition is getting worse. Contact your health care provider if your symptoms interfere with home or work and you do not feel like your condition is improving. Where to find help and support: You can get help and support from these sources:  Self-help groups.  Online and [employer].  A trusted spiritual leader.  Couples counseling.  Family education classes.  Family therapy. Follow these instructions at home:  Eat a healthy diet that includes plenty of vegetables, fruits, whole grains, low-fat dairy products, and lean protein. Do not eat a lot of foods that are high in solid fats, added sugars, or salt.  Exercise. Most adults should do the following: ? Exercise for at least 150 minutes each week. The exercise should increase your heart rate and make you sweat (moderate-intensity exercise). ? Strengthening exercises  at least twice a week.  Cut down on caffeine, tobacco, alcohol, and other potentially harmful substances.  Get the right amount and quality of sleep. Most adults need 7-9 hours of sleep each night.  Make choices that simplify your life.  Take over-the-counter and prescription medicines only as told by your health care provider.  Avoid caffeine, alcohol, and certain over-the-counter cold medicines. These may make you feel worse. Ask your pharmacist which medicines to avoid.  Keep all follow-up visits as told by your health care provider. This is important. Questions to ask your health care provider  Would I  benefit from therapy?  How often should I follow up with a health care provider?  How long do I need to take medicine?  Are there any long-term side effects of my medicine?  Are there any alternatives to taking medicine? Contact a health care provider if:  You have a hard time staying focused or finishing daily tasks.  You spend many hours a day feeling worried about everyday life.  You become exhausted by worry.  You start to have headaches, feel tense, or have nausea.  You urinate more than normal.  You have diarrhea. Get help right away if:  You have a racing heart and shortness of breath.  You have thoughts of hurting yourself or others. If you ever feel like you may hurt yourself or others, or have thoughts about taking your own life, get help right away. You can go to your nearest emergency department or call:  Your local emergency services (911 in the [place]  A suicide crisis helpline, such as the [facility] at 858 603 8508. This is open 24-hours a day. Summary  Taking steps to deal with stress can help calm you.  Medicines cannot cure anxiety disorders, but they can help ease symptoms.  Family, friends, and partners can play a big part in helping you recover from an anxiety disorder. This information is not intended to replace advice given to you by your health care provider. Make sure you discuss any questions you have with your health care provider. Document Released: 11/12/2016 Document Revised: 10/31/2017 Document Reviewed: 11/12/2016 Elsevier Patient Education  [place].  Nausea, Adult Nausea is the feeling that you have an upset stomach or that you are about to vomit. Nausea on its own is not usually a serious concern, but it may be an early sign of a more serious medical problem. As nausea gets worse, it can lead to vomiting. If vomiting develops, or if you are not able to drink enough fluids, you are at risk of becoming  dehydrated. Dehydration can make you tired and thirsty, cause you to have a dry mouth, and decrease how often you urinate. Older adults and people with other diseases or a weak disease-fighting system (immune system) are at higher risk for dehydration. The main goals of treating your nausea are:  To relieve your nausea.  To limit repeated nausea episodes.  To prevent vomiting and dehydration. Follow these instructions at home: Watch your symptoms for any changes. Tell your health care provider about them. Follow these instructions as told by your health care provider. Eating and drinking      Take an oral rehydration solution (ORS). This is a drink that is sold at pharmacies and retail stores.  Drink clear fluids slowly and in small amounts as you are able. Clear fluids include water, ice chips, low-calorie sports drinks, and fruit juice that has water added (diluted fruit juice).  Eat bland, easy-to-digest foods in small amounts as you are able. These foods include bananas, applesauce, rice, lean meats, toast, and crackers.  Avoid drinking fluids that contain a lot of sugar or caffeine, such as energy drinks, sports drinks, and soda.  Avoid alcohol.  Avoid spicy or fatty foods. General instructions  Take over-the-counter and prescription medicines only as told by your health care provider.  Rest at home while you recover.  Drink enough fluid to keep your urine pale yellow.  Breathe slowly and deeply when you feel nauseous.  Avoid smelling things that have strong odors.  Wash your hands often using soap and water. If soap and water are not available, use hand sanitizer.  Make sure that all people in your household wash their hands well and often.  Keep all follow-up visits as told by your health care provider. This is important. Contact a health care provider if:  Your nausea gets worse.  Your nausea does not go away after two days.  You vomit.  You cannot drink  fluids without vomiting.  You have any of the following: ? New symptoms. ? A fever. ? A headache. ? Muscle cramps. ? A rash. ? Pain while urinating.  You feel light-headed or dizzy. Get help right away if:  You have pain in your chest, neck, arm, or jaw.  You feel extremely weak or you faint.  You have vomit that is bright red or looks like coffee grounds.  You have bloody or black stools or stools that look like tar.  You have a severe headache, a stiff neck, or both.  You have severe pain, cramping, or bloating in your abdomen.  You have difficulty breathing or are breathing very quickly.  Your heart is beating very quickly.  Your skin feels cold and clammy.  You feel confused.  You have signs of dehydration, such as: ? Dark urine, very little urine, or no urine. ? Cracked lips. ? Dry mouth. ? Sunken eyes. ? Sleepiness. ? Weakness. These symptoms may represent a serious problem that is an emergency. Do not wait to see if the symptoms will go away. Get medical help right away. Call your local emergency services (911 in the [place] Do not drive yourself to the hospital. Summary  Nausea is the feeling that you have an upset stomach or that you are about to vomit. Nausea on its own is not usually a serious concern, but it may be an early sign of a more serious medical problem.  If vomiting develops, or if you are not able to drink enough fluids, you are at risk of becoming dehydrated.  Follow recommendations for eating and drinking and take over-the-counter and prescription medicines only as told by your health care provider.  Contact a health care provider right away if your symptoms worsen or you have new symptoms.  Keep all follow-up visits as told by your health care provider. This is important. This information is not intended to replace advice given to you by your health care provider. Make sure you discuss any questions you have with your health care  provider. Document Released: 12/26/2004 Document Revised: 04/28/2018 Document Reviewed: 04/28/2018 Elsevier Patient Education  2020 [employer].

## 2019-10-12 ENCOUNTER — Encounter: Payer: Self-pay | Admitting: Internal Medicine

## 2019-10-13 ENCOUNTER — Other Ambulatory Visit: Payer: Self-pay | Admitting: Internal Medicine

## 2019-10-13 DIAGNOSIS — R110 Nausea: Secondary | ICD-10-CM

## 2019-10-13 DIAGNOSIS — F419 Anxiety disorder, unspecified: Secondary | ICD-10-CM

## 2019-10-13 DIAGNOSIS — J309 Allergic rhinitis, unspecified: Secondary | ICD-10-CM

## 2019-10-13 DIAGNOSIS — F329 Major depressive disorder, single episode, unspecified: Secondary | ICD-10-CM

## 2019-10-13 DIAGNOSIS — F32A Depression, unspecified: Secondary | ICD-10-CM

## 2019-10-13 MED ORDER — ONDANSETRON HCL 4 MG PO TABS: 4.0000 mg | ORAL_TABLET | Freq: Three times a day (TID) | ORAL | 0 refills | Status: DC | PRN

## 2019-10-13 MED ORDER — CETIRIZINE HCL 10 MG PO TABS: 10.0000 mg | ORAL_TABLET | Freq: Every day | ORAL | 3 refills | Status: DC | PRN

## 2019-10-13 MED ORDER — BUPROPION HCL ER (XL) 150 MG PO TB24: 150.0000 mg | ORAL_TABLET | Freq: Every day | ORAL | 3 refills | Status: DC

## 2019-10-13 MED ORDER — HYDROXYZINE HCL 25 MG PO TABS: 25.0000 mg | ORAL_TABLET | Freq: Two times a day (BID) | ORAL | 2 refills | Status: DC | PRN

## 2019-10-22 LAB — CBC WITH DIFFERENTIAL/PLATELET
Basophils Absolute: 0 10*3/uL (ref 0.0–0.2)
Basos: 0 %
EOS (ABSOLUTE): 0 10*3/uL (ref 0.0–0.4)
Eos: 0 %
Hematocrit: 39.5 % (ref 34.0–46.6)
Hemoglobin: 13 g/dL (ref 11.1–15.9)
Immature Grans (Abs): 0 10*3/uL (ref 0.0–0.1)
Immature Granulocytes: 0 %
Lymphocytes Absolute: 2.4 10*3/uL (ref 0.7–3.1)
Lymphs: 30 %
MCH: 31.6 pg (ref 26.6–33.0)
MCHC: 32.9 g/dL (ref 31.5–35.7)
MCV: 96 fL (ref 79–97)
Monocytes Absolute: 0.5 10*3/uL (ref 0.1–0.9)
Monocytes: 7 %
Neutrophils Absolute: 5.1 10*3/uL (ref 1.4–7.0)
Neutrophils: 63 %
Platelets: 263 10*3/uL (ref 150–450)
RBC: 4.12 x10E6/uL (ref 3.77–5.28)
RDW: 11.7 % (ref 11.7–15.4)
WBC: 8.2 10*3/uL (ref 3.4–10.8)

## 2019-10-22 LAB — COMPREHENSIVE METABOLIC PANEL
ALT: 16 IU/L (ref 0–32)
AST: 16 IU/L (ref 0–40)
Albumin/Globulin Ratio: 1.8 (ref 1.2–2.2)
Albumin: 4.7 g/dL (ref 3.8–4.8)
Alkaline Phosphatase: 69 IU/L (ref 39–117)
BUN/Creatinine Ratio: 9 (ref 9–23)
BUN: 7 mg/dL (ref 6–24)
Bilirubin Total: 0.6 mg/dL (ref 0.0–1.2)
CO2: 20 mmol/L (ref 20–29)
Calcium: 9.4 mg/dL (ref 8.7–10.2)
Chloride: 102 mmol/L (ref 96–106)
Creatinine, Ser: 0.81 mg/dL (ref 0.57–1.00)
GFR calc Af Amer: 105 mL/min/{1.73_m2} (ref >59)
GFR calc non Af Amer: 91 mL/min/{1.73_m2} (ref >59)
Globulin, Total: 2.6 g/dL (ref 1.5–4.5)
Glucose: 87 mg/dL (ref 65–99)
Potassium: 4 mmol/L (ref 3.5–5.2)
Sodium: 136 mmol/L (ref 134–144)
Total Protein: 7.3 g/dL (ref 6.0–8.5)

## 2019-10-22 LAB — HEMOGLOBIN A1C
Est. average glucose Bld gHb Est-mCnc: 111 mg/dL
Hgb A1c MFr Bld: 5.5 % (ref 4.8–5.6)

## 2019-10-22 LAB — TSH: TSH: 0.737 u[IU]/mL (ref 0.450–4.500)

## 2019-10-22 LAB — URINALYSIS, ROUTINE W REFLEX MICROSCOPIC
Bilirubin, UA: NEGATIVE
Glucose, UA: NEGATIVE
Ketones, UA: NEGATIVE
Leukocytes,UA: NEGATIVE
Nitrite, UA: NEGATIVE
Protein,UA: NEGATIVE
RBC, UA: NEGATIVE
Specific Gravity, UA: 1.014 (ref 1.005–1.030)
Urobilinogen, Ur: 0.2 mg/dL (ref 0.2–1.0)
pH, UA: 5.5 (ref 5.0–7.5)

## 2019-10-22 LAB — T4, FREE: Free T4: 1.21 ng/dL (ref 0.82–1.77)

## 2019-10-22 LAB — LIPID PANEL
Chol/HDL Ratio: 2.3 ratio (ref 0.0–4.4)
Cholesterol, Total: 152 mg/dL (ref 100–199)
HDL: 65 mg/dL (ref >39)
LDL Chol Calc (NIH): 69 mg/dL (ref 0–99)
Triglycerides: 101 mg/dL (ref 0–149)
VLDL Cholesterol Cal: 18 mg/dL (ref 5–40)

## 2019-10-22 LAB — VITAMIN D 25 HYDROXY (VIT D DEFICIENCY, FRACTURES): Vit D, 25-Hydroxy: 15.3 ng/mL — ABNORMAL LOW (ref 30.0–100.0)

## 2019-10-24 ENCOUNTER — Other Ambulatory Visit: Payer: Self-pay | Admitting: Internal Medicine

## 2019-10-24 DIAGNOSIS — E559 Vitamin D deficiency, unspecified: Secondary | ICD-10-CM

## 2019-10-24 MED ORDER — CHOLECALCIFEROL 1.25 MG (50000 UT) PO CAPS: 50000.0000 [IU] | ORAL_CAPSULE | ORAL | 1 refills | Status: DC

## 2019-10-30 IMAGING — US US ABDOMEN LIMITED
1 series · 14 of 25 positions shown · non-contrast
Comparison: None

CLINICAL DATA: Upper abdominal pain for 1 day with nausea,
vomiting, and diarrhea

EXAM:
ULTRASOUND ABDOMEN LIMITED RIGHT UPPER QUADRANT

[Series 1: us abdomen limited · 0.21mm/px · 14 of 53 slices shown]
[im 1/53]
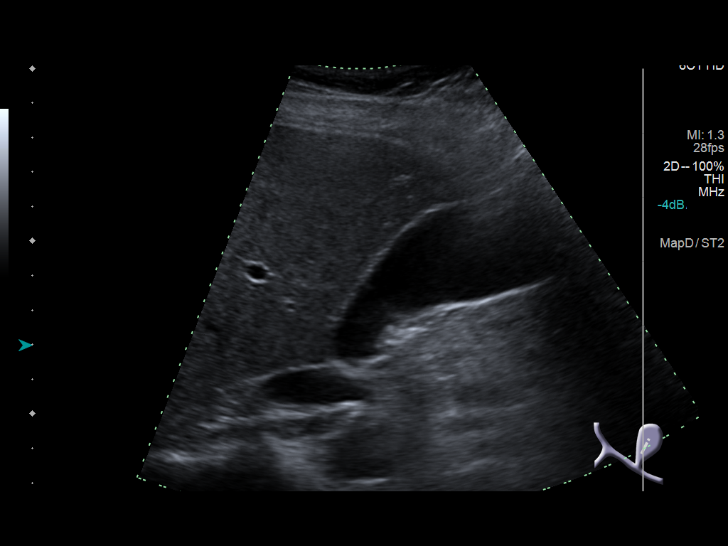
[im 5/53]
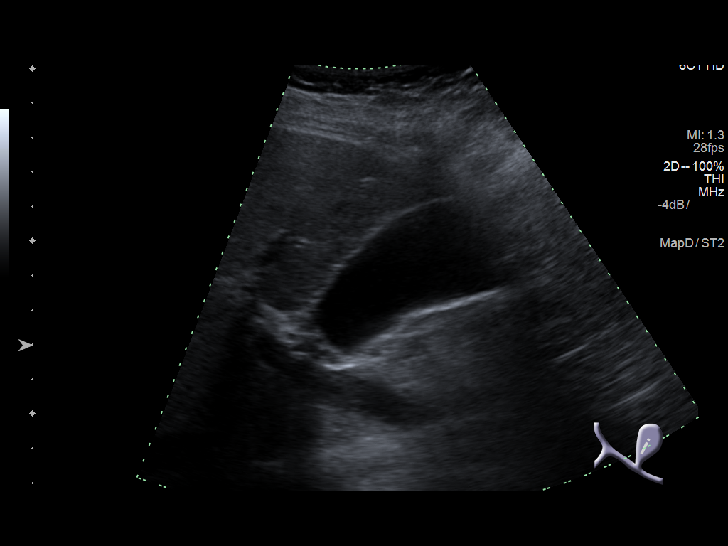
[im 9/53]
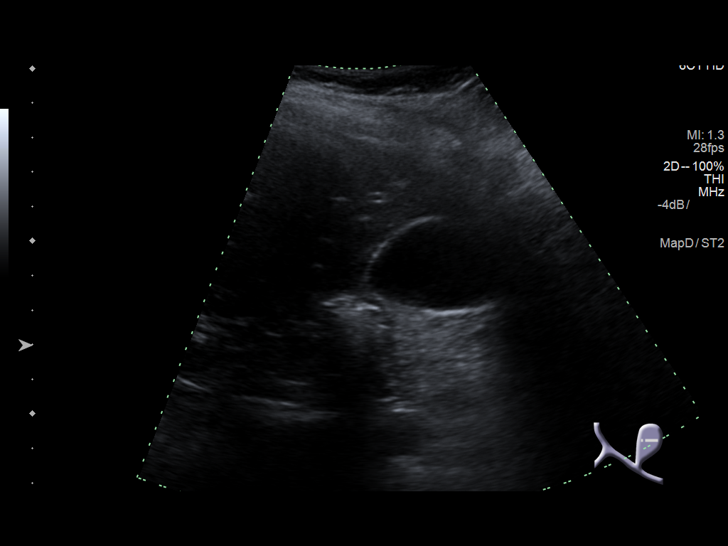
[im 14/53]
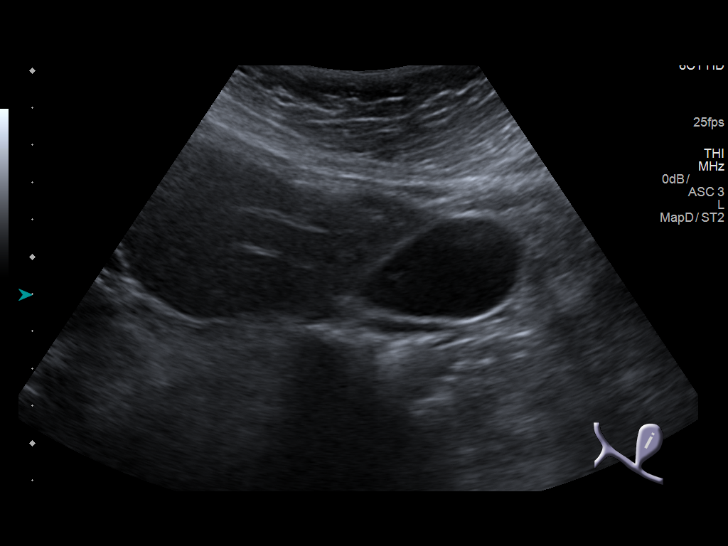
[im 18/53]
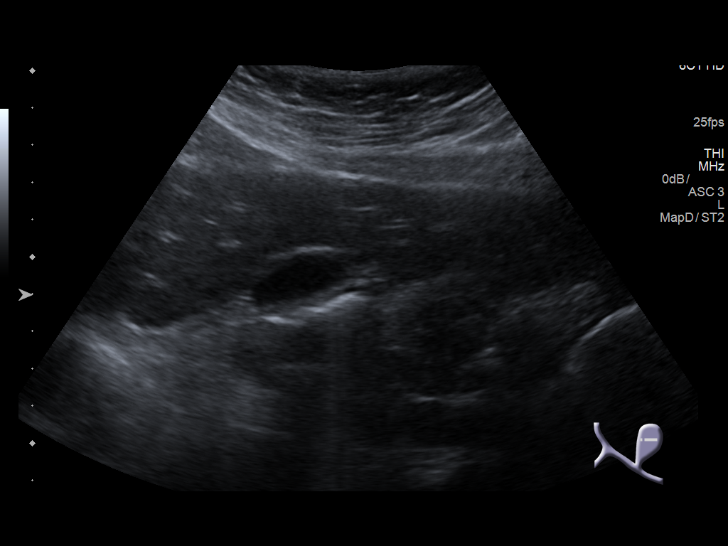
[im 20/53]
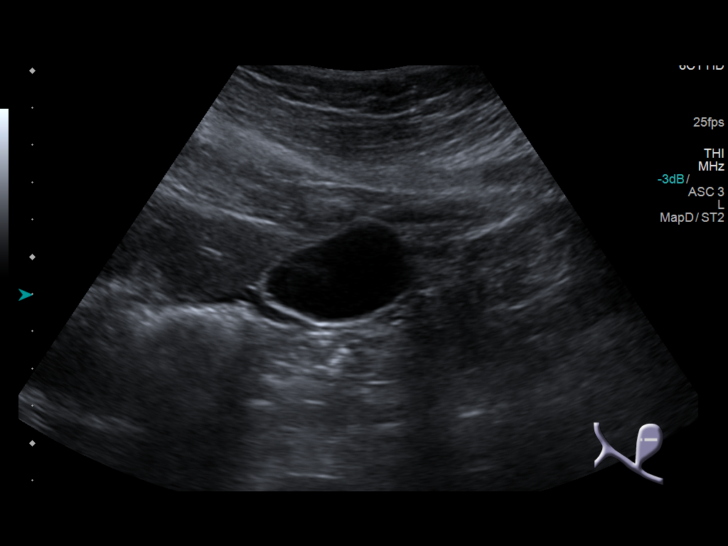
[im 24/53]
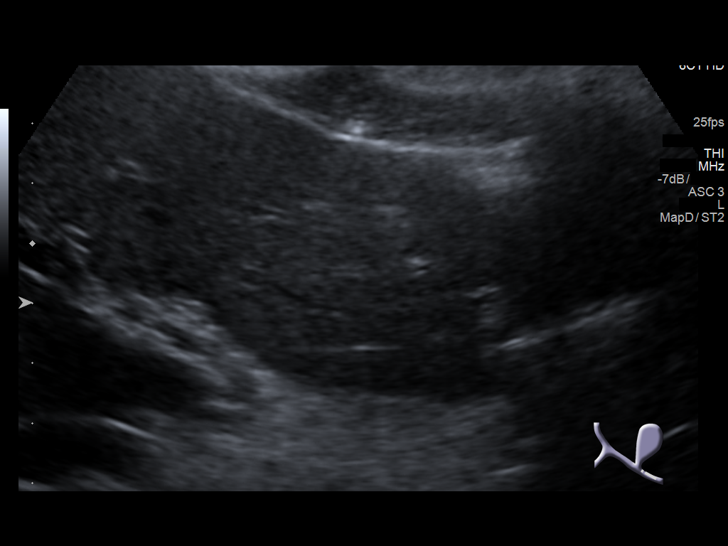
[im 29/53]
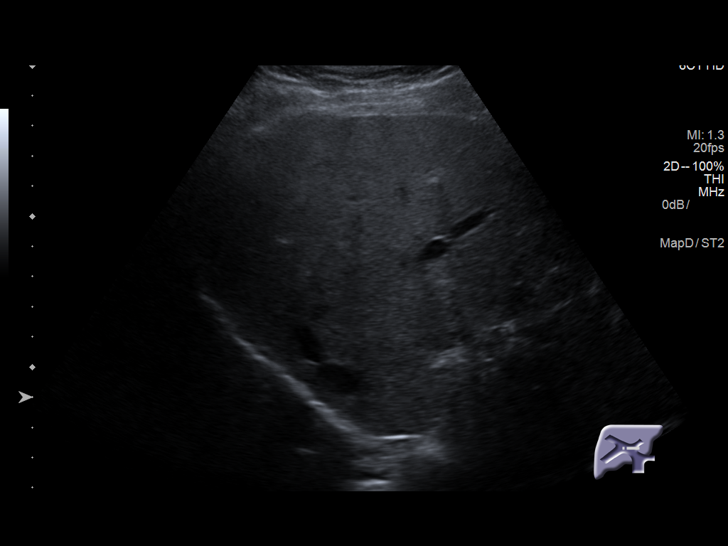
[im 33/53]
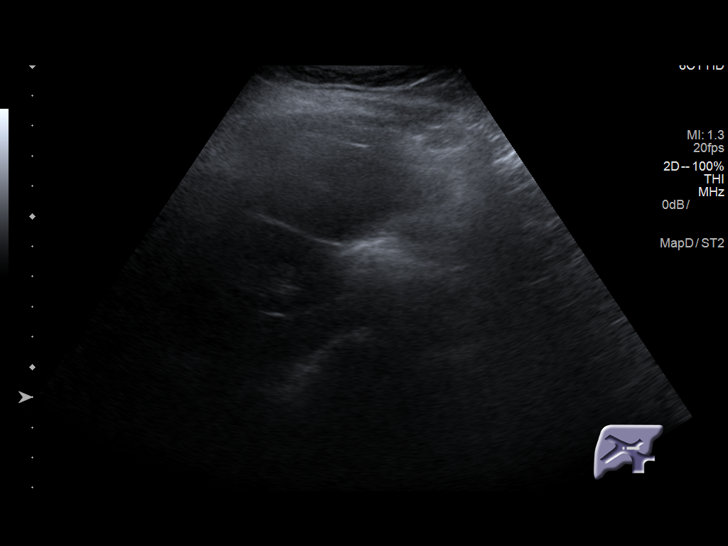
[im 35/53]
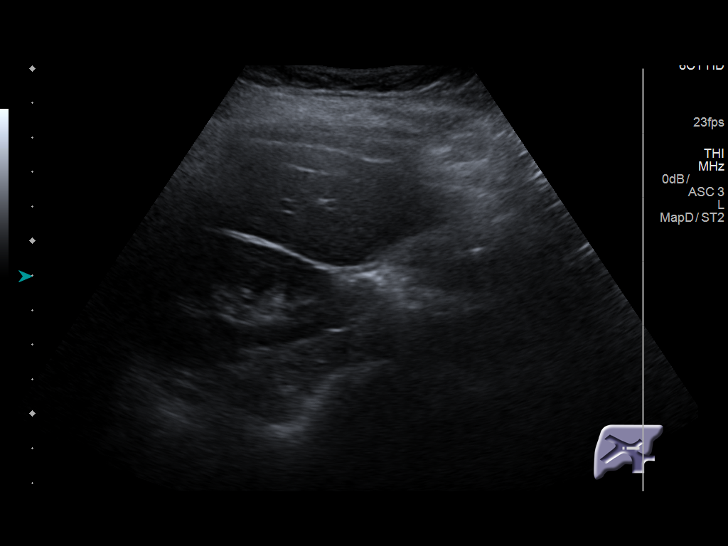
[im 40/53]
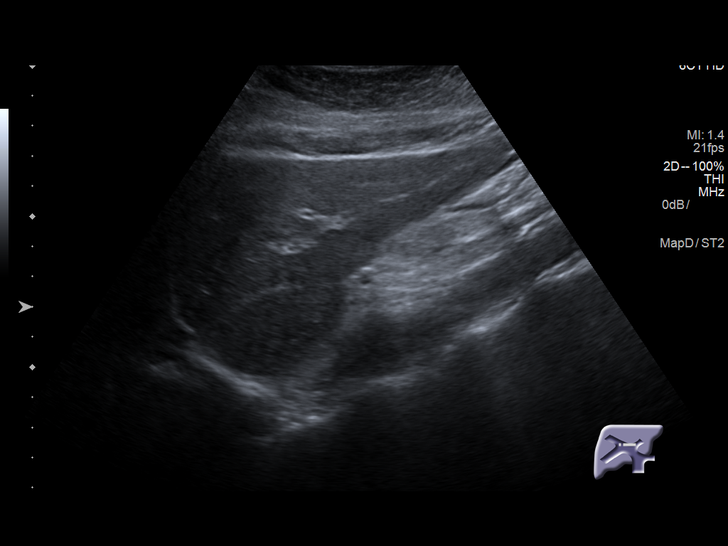
[im 44/53]
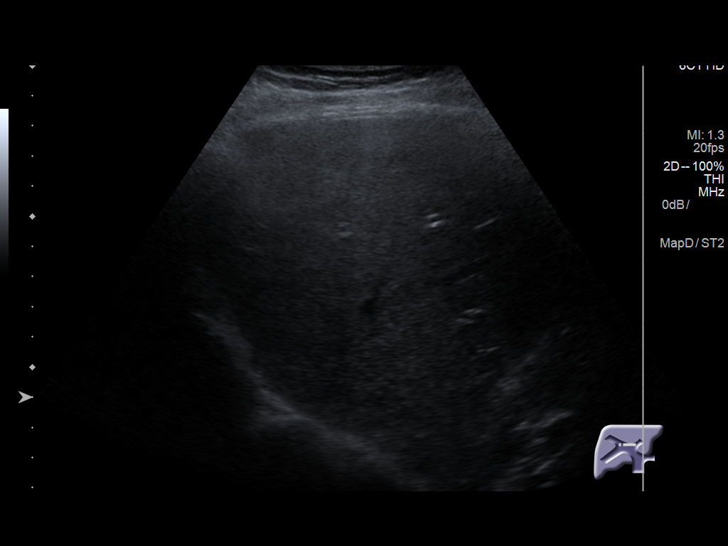
[im 48/53]
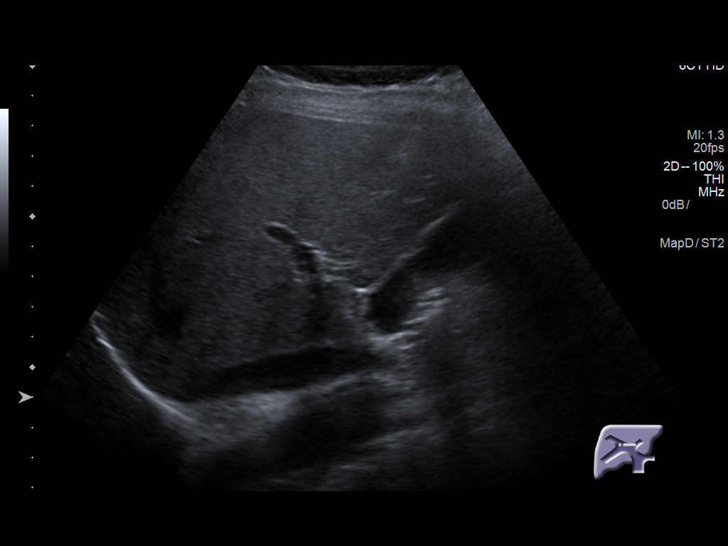
[im 53/53]
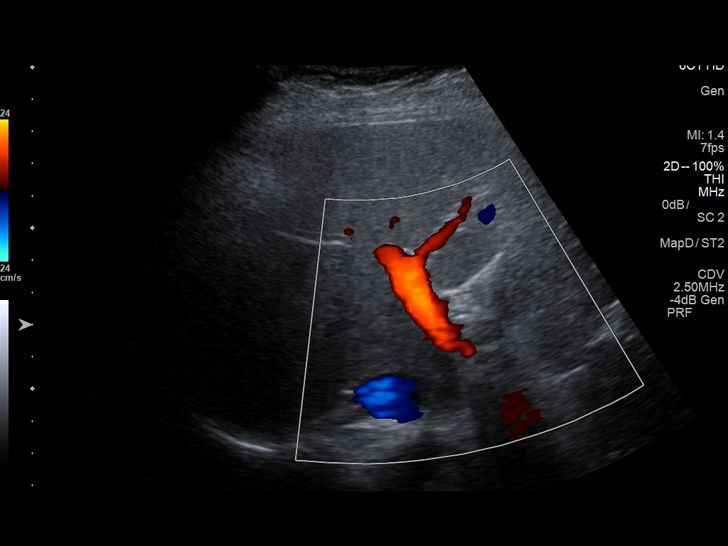

[14 of 25 positions shown; findings below may reference images not displayed]

FINDINGS: Gallbladder:

Normally distended without stones or wall thickening. No
pericholecystic fluid or sonographic Murphy sign.

Common bile duct:

Diameter: 3 mm diameter, normal

Liver:

Normal appearance. No focal hepatic mass or nodularity. Portal vein
is patent on color Doppler imaging with normal direction of blood
flow towards the liver.

No RIGHT upper quadrant free fluid.
IMPRESSION: Normal exam.

## 2019-11-09 LAB — HM MAMMOGRAPHY
HM Mammogram: ABNORMAL — AB (ref 0–4)
HM Mammogram: ABNORMAL — AB (ref 0–4)

## 2019-11-16 ENCOUNTER — Telehealth: Payer: Self-pay | Admitting: Internal Medicine

## 2019-11-16 ENCOUNTER — Other Ambulatory Visit: Payer: Self-pay | Admitting: Internal Medicine

## 2019-11-16 ENCOUNTER — Encounter: Payer: Self-pay | Admitting: Internal Medicine

## 2019-11-16 DIAGNOSIS — R928 Other abnormal and inconclusive findings on diagnostic imaging of breast: Secondary | ICD-10-CM

## 2019-11-16 NOTE — Telephone Encounter (Signed)
Left message for patient to return call back. [facility] may give results and obtain information.  

## 2019-11-16 NOTE — Telephone Encounter (Signed)
Mammogram abnormal ordered Diagnostic mammo and [place] right breast to further work up it may be normal but sch [facility]   [facility]

## 2019-12-13 ENCOUNTER — Telehealth: Payer: Self-pay | Admitting: Internal Medicine

## 2019-12-13 ENCOUNTER — Encounter: Payer: Self-pay | Admitting: Internal Medicine

## 2019-12-13 ENCOUNTER — Encounter: Payer: Self-pay | Admitting: *Deleted

## 2019-12-13 NOTE — Telephone Encounter (Signed)
Pt needs to call [facility] mammogram 11/09/19 was abnormal she they mailed her letter to call back and schedule further imaging right breast for right breast mass indeterminate and as of 12/09/19 she has not called to schedule  [place]

## 2019-12-13 NOTE — Telephone Encounter (Signed)
Sent mychart message

## 2019-12-29 ENCOUNTER — Telehealth: Payer: Self-pay | Admitting: Internal Medicine

## 2019-12-29 NOTE — Telephone Encounter (Signed)
Pls sch right breast mammogram and [place] due since 11/2019 [facility]  Inform pt   Thanks [place]

## 2020-01-14 ENCOUNTER — Ambulatory Visit: Payer: BC Managed Care – PPO | Admitting: Internal Medicine

## 2020-10-03 ENCOUNTER — Other Ambulatory Visit (HOSPITAL_COMMUNITY): Payer: Self-pay | Admitting: Orthopedic Surgery

## 2020-10-13 ENCOUNTER — Encounter (HOSPITAL_BASED_OUTPATIENT_CLINIC_OR_DEPARTMENT_OTHER): Payer: Self-pay | Admitting: Orthopedic Surgery

## 2020-10-13 ENCOUNTER — Other Ambulatory Visit: Payer: Self-pay

## 2020-10-16 ENCOUNTER — Other Ambulatory Visit (HOSPITAL_COMMUNITY)
Admission: RE | Admit: 2020-10-16 | Discharge: 2020-10-16 | Disposition: A | Discharge status: Home / Self Care | Payer: BC Managed Care – PPO | Source: Ambulatory Visit | Attending: Orthopedic Surgery | Admitting: Orthopedic Surgery

## 2020-10-16 DIAGNOSIS — Z79899 Other long term (current) drug therapy: Secondary | ICD-10-CM | POA: Diagnosis not present

## 2020-10-16 DIAGNOSIS — Z833 Family history of diabetes mellitus: Secondary | ICD-10-CM | POA: Diagnosis not present

## 2020-10-16 DIAGNOSIS — Z8261 Family history of arthritis: Secondary | ICD-10-CM | POA: Diagnosis not present

## 2020-10-16 DIAGNOSIS — M2011 Hallux valgus (acquired), right foot: Secondary | ICD-10-CM | POA: Diagnosis present

## 2020-10-16 DIAGNOSIS — Z818 Family history of other mental and behavioral disorders: Secondary | ICD-10-CM | POA: Diagnosis not present

## 2020-10-16 DIAGNOSIS — Z823 Family history of stroke: Secondary | ICD-10-CM | POA: Diagnosis not present

## 2020-10-16 DIAGNOSIS — Z20822 Contact with and (suspected) exposure to covid-19: Secondary | ICD-10-CM | POA: Diagnosis not present

## 2020-10-16 DIAGNOSIS — Z825 Family history of asthma and other chronic lower respiratory diseases: Secondary | ICD-10-CM | POA: Diagnosis not present

## 2020-10-16 DIAGNOSIS — K219 Gastro-esophageal reflux disease without esophagitis: Secondary | ICD-10-CM | POA: Diagnosis not present

## 2020-10-16 DIAGNOSIS — Z8249 Family history of ischemic heart disease and other diseases of the circulatory system: Secondary | ICD-10-CM | POA: Diagnosis not present

## 2020-10-16 DIAGNOSIS — Z8262 Family history of osteoporosis: Secondary | ICD-10-CM | POA: Diagnosis not present

## 2020-10-16 DIAGNOSIS — Z888 Allergy status to other drugs, medicaments and biological substances status: Secondary | ICD-10-CM | POA: Diagnosis not present

## 2020-10-16 DIAGNOSIS — Z01812 Encounter for preprocedural laboratory examination: Secondary | ICD-10-CM | POA: Insufficient documentation

## 2020-10-16 DIAGNOSIS — Z8349 Family history of other endocrine, nutritional and metabolic diseases: Secondary | ICD-10-CM | POA: Diagnosis not present

## 2020-10-16 LAB — SARS CORONAVIRUS 2 (TAT 6-24 HRS): SARS Coronavirus 2: NEGATIVE

## 2020-10-17 NOTE — Progress Notes (Signed)

## 2020-10-19 ENCOUNTER — Encounter (HOSPITAL_BASED_OUTPATIENT_CLINIC_OR_DEPARTMENT_OTHER)
Admission: RE | Disposition: A | Discharge status: Home / Self Care | Payer: Self-pay | Source: Home / Self Care | Attending: Orthopedic Surgery

## 2020-10-19 ENCOUNTER — Ambulatory Visit (HOSPITAL_BASED_OUTPATIENT_CLINIC_OR_DEPARTMENT_OTHER): Payer: BC Managed Care – PPO | Admitting: Certified Registered"

## 2020-10-19 ENCOUNTER — Ambulatory Visit (HOSPITAL_BASED_OUTPATIENT_CLINIC_OR_DEPARTMENT_OTHER)
Admission: RE | Admit: 2020-10-19 | Discharge: 2020-10-19 | Disposition: A | Discharge status: Home / Self Care | Payer: BC Managed Care – PPO | Attending: Orthopedic Surgery | Admitting: Orthopedic Surgery

## 2020-10-19 ENCOUNTER — Encounter (HOSPITAL_BASED_OUTPATIENT_CLINIC_OR_DEPARTMENT_OTHER): Payer: Self-pay | Admitting: Orthopedic Surgery

## 2020-10-19 ENCOUNTER — Other Ambulatory Visit: Payer: Self-pay

## 2020-10-19 DIAGNOSIS — Z8261 Family history of arthritis: Secondary | ICD-10-CM | POA: Insufficient documentation

## 2020-10-19 DIAGNOSIS — K219 Gastro-esophageal reflux disease without esophagitis: Secondary | ICD-10-CM | POA: Insufficient documentation

## 2020-10-19 DIAGNOSIS — M21611 Bunion of right foot: Secondary | ICD-10-CM

## 2020-10-19 DIAGNOSIS — Z8262 Family history of osteoporosis: Secondary | ICD-10-CM | POA: Insufficient documentation

## 2020-10-19 DIAGNOSIS — Z833 Family history of diabetes mellitus: Secondary | ICD-10-CM | POA: Insufficient documentation

## 2020-10-19 DIAGNOSIS — Z823 Family history of stroke: Secondary | ICD-10-CM | POA: Insufficient documentation

## 2020-10-19 DIAGNOSIS — Z818 Family history of other mental and behavioral disorders: Secondary | ICD-10-CM | POA: Insufficient documentation

## 2020-10-19 DIAGNOSIS — Z888 Allergy status to other drugs, medicaments and biological substances status: Secondary | ICD-10-CM | POA: Insufficient documentation

## 2020-10-19 DIAGNOSIS — Z825 Family history of asthma and other chronic lower respiratory diseases: Secondary | ICD-10-CM | POA: Insufficient documentation

## 2020-10-19 DIAGNOSIS — Z79899 Other long term (current) drug therapy: Secondary | ICD-10-CM | POA: Insufficient documentation

## 2020-10-19 DIAGNOSIS — Z8349 Family history of other endocrine, nutritional and metabolic diseases: Secondary | ICD-10-CM | POA: Insufficient documentation

## 2020-10-19 DIAGNOSIS — Z8249 Family history of ischemic heart disease and other diseases of the circulatory system: Secondary | ICD-10-CM | POA: Insufficient documentation

## 2020-10-19 DIAGNOSIS — M2011 Hallux valgus (acquired), right foot: Secondary | ICD-10-CM | POA: Insufficient documentation

## 2020-10-19 DIAGNOSIS — Z20822 Contact with and (suspected) exposure to covid-19: Secondary | ICD-10-CM | POA: Insufficient documentation

## 2020-10-19 HISTORY — PX: BUNIONECTOMY: SHX129

## 2020-10-19 HISTORY — DX: Other specified postprocedural states: R11.2

## 2020-10-19 HISTORY — DX: Other specified postprocedural states: Z98.890

## 2020-10-19 LAB — POCT PREGNANCY, URINE: Preg Test, Ur: NEGATIVE

## 2020-10-19 SURGERY — BUNIONECTOMY
Anesthesia: General | Site: Foot | Laterality: Right

## 2020-10-19 MED ORDER — MEPERIDINE HCL 25 MG/ML IJ SOLN: 6.2500 mg | INTRAMUSCULAR | Status: DC | PRN

## 2020-10-19 MED ORDER — FENTANYL CITRATE (PF) 100 MCG/2ML IJ SOLN
INTRAMUSCULAR | Status: AC
  Filled 2020-10-19: qty 2

## 2020-10-19 MED ORDER — DOCUSATE SODIUM 100 MG PO CAPS: 100.0000 mg | ORAL_CAPSULE | Freq: Two times a day (BID) | ORAL | 0 refills | Status: DC

## 2020-10-19 MED ORDER — VANCOMYCIN HCL 500 MG IV SOLR
INTRAVENOUS | Status: DC | PRN
  Administered 2020-10-19: 500 mg via TOPICAL

## 2020-10-19 MED ORDER — OXYCODONE HCL 5 MG PO TABS: 5.0000 mg | ORAL_TABLET | Freq: Once | ORAL | Status: DC | PRN

## 2020-10-19 MED ORDER — LACTATED RINGERS IV SOLN: INTRAVENOUS | Status: DC

## 2020-10-19 MED ORDER — CEFAZOLIN SODIUM-DEXTROSE 2-4 GM/100ML-% IV SOLN
INTRAVENOUS | Status: AC
  Filled 2020-10-19: qty 100

## 2020-10-19 MED ORDER — DROPERIDOL 2.5 MG/ML IJ SOLN
INTRAMUSCULAR | Status: AC
  Filled 2020-10-19: qty 2

## 2020-10-19 MED ORDER — LIDOCAINE 2% (20 MG/ML) 5 ML SYRINGE
INTRAMUSCULAR | Status: DC | PRN
  Administered 2020-10-19: 30 mg via INTRAVENOUS

## 2020-10-19 MED ORDER — SENNA 8.6 MG PO TABS: 2.0000 | ORAL_TABLET | Freq: Two times a day (BID) | ORAL | 0 refills | Status: DC

## 2020-10-19 MED ORDER — MIDAZOLAM HCL 2 MG/2ML IJ SOLN
2.0000 mg | Freq: Once | INTRAMUSCULAR | Status: AC
  Administered 2020-10-19: 2 mg via INTRAVENOUS

## 2020-10-19 MED ORDER — SODIUM CHLORIDE 0.9 % IV SOLN: INTRAVENOUS | Status: DC

## 2020-10-19 MED ORDER — VANCOMYCIN HCL 500 MG IV SOLR
INTRAVENOUS | Status: AC
  Filled 2020-10-19: qty 500

## 2020-10-19 MED ORDER — ROPIVACAINE HCL 5 MG/ML IJ SOLN
INTRAMUSCULAR | Status: DC | PRN
  Administered 2020-10-19: 30 mL via PERINEURAL

## 2020-10-19 MED ORDER — OXYCODONE HCL 5 MG/5ML PO SOLN: 5.0000 mg | Freq: Once | ORAL | Status: DC | PRN

## 2020-10-19 MED ORDER — FENTANYL CITRATE (PF) 100 MCG/2ML IJ SOLN
100.0000 ug | Freq: Once | INTRAMUSCULAR | Status: AC
  Administered 2020-10-19: 100 ug via INTRAVENOUS

## 2020-10-19 MED ORDER — HYDROMORPHONE HCL 1 MG/ML IJ SOLN: 0.2500 mg | INTRAMUSCULAR | Status: DC | PRN

## 2020-10-19 MED ORDER — 0.9 % SODIUM CHLORIDE (POUR BTL) OPTIME
TOPICAL | Status: DC | PRN
  Administered 2020-10-19: 200 mL

## 2020-10-19 MED ORDER — PHENYLEPHRINE HCL (PRESSORS) 10 MG/ML IV SOLN
INTRAVENOUS | Status: DC | PRN
  Administered 2020-10-19: 40 ug via INTRAVENOUS

## 2020-10-19 MED ORDER — PROPOFOL 10 MG/ML IV BOLUS
INTRAVENOUS | Status: DC | PRN
  Administered 2020-10-19: 200 mg via INTRAVENOUS

## 2020-10-19 MED ORDER — CEFAZOLIN SODIUM-DEXTROSE 2-4 GM/100ML-% IV SOLN
2.0000 g | INTRAVENOUS | Status: AC
  Administered 2020-10-19: 2 g via INTRAVENOUS

## 2020-10-19 MED ORDER — MIDAZOLAM HCL 2 MG/2ML IJ SOLN
INTRAMUSCULAR | Status: AC
  Filled 2020-10-19: qty 2

## 2020-10-19 MED ORDER — DROPERIDOL 2.5 MG/ML IJ SOLN
INTRAMUSCULAR | Status: DC | PRN
  Administered 2020-10-19: .625 mg via INTRAVENOUS

## 2020-10-19 MED ORDER — ONDANSETRON HCL 4 MG/2ML IJ SOLN
INTRAMUSCULAR | Status: DC | PRN
  Administered 2020-10-19: 4 mg via INTRAVENOUS

## 2020-10-19 MED ORDER — OXYCODONE HCL 5 MG PO TABS
5.0000 mg | ORAL_TABLET | Freq: Four times a day (QID) | ORAL | 0 refills | Status: AC | PRN
Start: 2020-10-19 — End: 2020-10-24

## 2020-10-19 MED ORDER — PROMETHAZINE HCL 25 MG/ML IJ SOLN: 6.2500 mg | INTRAMUSCULAR | Status: DC | PRN

## 2020-10-19 MED ORDER — PROPOFOL 500 MG/50ML IV EMUL
INTRAVENOUS | Status: DC | PRN
  Administered 2020-10-19: 25 ug/kg/min via INTRAVENOUS

## 2020-10-19 SURGICAL SUPPLY — 89 items
APL PRP STRL LF DISP 70% ISPRP (MISCELLANEOUS) ×1
BANDAGE ESMARK 6X9 LF (GAUZE/BANDAGES/DRESSINGS) IMPLANT
BIT DRILL 1.8 CANN MAX VPC (BIT) ×2 IMPLANT
BIT DRILL PRFL CANNULTD 2.5MM (BIT) IMPLANT
BLADE AVERAGE 25MMX9MM (BLADE) ×1
BLADE AVERAGE 25X9 (BLADE) ×1 IMPLANT
BLADE LONG MED 25X9 (BLADE) IMPLANT
BLADE LONG MED 25X9MM (BLADE)
BLADE MICRO SAGITTAL (BLADE) IMPLANT
BLADE MINI RND TIP GREEN BEAV (BLADE) IMPLANT
BLADE OSC/SAG .038X5.5 CUT EDG (BLADE) IMPLANT
BLADE SURG 15 STRL LF DISP TIS (BLADE) ×3 IMPLANT
BLADE SURG 15 STRL SS (BLADE) ×6
BNDG CMPR 9X6 STRL LF SNTH (GAUZE/BANDAGES/DRESSINGS)
BNDG COHESIVE 4X5 TAN STRL (GAUZE/BANDAGES/DRESSINGS) ×3 IMPLANT
BNDG COHESIVE 6X5 TAN STRL LF (GAUZE/BANDAGES/DRESSINGS) IMPLANT
BNDG CONFORM 2 STRL LF (GAUZE/BANDAGES/DRESSINGS) IMPLANT
BNDG CONFORM 3 STRL LF (GAUZE/BANDAGES/DRESSINGS) ×3 IMPLANT
BNDG ESMARK 6X9 LF (GAUZE/BANDAGES/DRESSINGS)
CHLORAPREP W/TINT 26 (MISCELLANEOUS) ×3 IMPLANT
COVER BACK TABLE 60X90IN (DRAPES) ×3 IMPLANT
COVER WAND RF STERILE (DRAPES) IMPLANT
CUFF TOURN SGL QUICK 24 (TOURNIQUET CUFF) ×3
CUFF TOURN SGL QUICK 34 (TOURNIQUET CUFF)
CUFF TRNQT CYL 24X4X16.5-23 (TOURNIQUET CUFF) IMPLANT
CUFF TRNQT CYL 34X4.125X (TOURNIQUET CUFF) IMPLANT
DRAPE EXTREMITY T 121X128X90 (DISPOSABLE) ×3 IMPLANT
DRAPE OEC MINIVIEW 54X84 (DRAPES) ×3 IMPLANT
DRAPE U-SHAPE 47X51 STRL (DRAPES) ×3 IMPLANT
DRILL PROFILE CANNULATED 2.5MM (BIT) ×3
DRSG MEPITEL 4X7.2 (GAUZE/BANDAGES/DRESSINGS) ×3 IMPLANT
DRSG PAD ABDOMINAL 8X10 ST (GAUZE/BANDAGES/DRESSINGS) ×3 IMPLANT
ELECT REM PT RETURN 9FT ADLT (ELECTROSURGICAL) ×3
ELECTRODE REM PT RTRN 9FT ADLT (ELECTROSURGICAL) ×1 IMPLANT
GAUZE SPONGE 4X4 12PLY STRL (GAUZE/BANDAGES/DRESSINGS) ×3 IMPLANT
GLOVE BIO SURGEON STRL SZ8 (GLOVE) ×3 IMPLANT
GLOVE BIOGEL PI IND STRL 7.0 (GLOVE) IMPLANT
GLOVE BIOGEL PI IND STRL 7.5 (GLOVE) IMPLANT
GLOVE BIOGEL PI IND STRL 8 (GLOVE) ×2 IMPLANT
GLOVE BIOGEL PI INDICATOR 7.0 (GLOVE) ×2
GLOVE BIOGEL PI INDICATOR 7.5 (GLOVE) ×2
GLOVE BIOGEL PI INDICATOR 8 (GLOVE) ×4
GLOVE ECLIPSE 8.0 STRL XLNG CF (GLOVE) ×3 IMPLANT
GLOVE SURG SS PI 7.0 STRL IVOR (GLOVE) ×2 IMPLANT
GOWN STRL REUS W/ TWL LRG LVL3 (GOWN DISPOSABLE) ×1 IMPLANT
GOWN STRL REUS W/ TWL XL LVL3 (GOWN DISPOSABLE) ×2 IMPLANT
GOWN STRL REUS W/TWL LRG LVL3 (GOWN DISPOSABLE) ×6
GOWN STRL REUS W/TWL XL LVL3 (GOWN DISPOSABLE) ×6
K-WIRE COCR 0.9X95 (WIRE) ×3
K-WIRE DBL END .054 LG (WIRE) ×3 IMPLANT
KWIRE COCR 0.9X95 (WIRE) IMPLANT
NDL HYPO 25X1 1.5 SAFETY (NEEDLE) IMPLANT
NEEDLE HYPO 22GX1.5 SAFETY (NEEDLE) IMPLANT
NEEDLE HYPO 25X1 1.5 SAFETY (NEEDLE) IMPLANT
NS IRRIG 1000ML POUR BTL (IV SOLUTION) ×3 IMPLANT
PACK BASIN DAY SURGERY FS (CUSTOM PROCEDURE TRAY) ×3 IMPLANT
PAD CAST 4YDX4 CTTN HI CHSV (CAST SUPPLIES) ×1 IMPLANT
PADDING CAST ABS 4INX4YD NS (CAST SUPPLIES)
PADDING CAST ABS COTTON 4X4 ST (CAST SUPPLIES) IMPLANT
PADDING CAST COTTON 4X4 STRL (CAST SUPPLIES) ×3
PADDING CAST COTTON 6X4 STRL (CAST SUPPLIES) IMPLANT
PENCIL SMOKE EVACUATOR (MISCELLANEOUS) ×3 IMPLANT
SANITIZER HAND PURELL 535ML FO (MISCELLANEOUS) ×3 IMPLANT
SCREW MAX VPC  2.5X20 (Screw) ×6 IMPLANT
SCREW MAX VPC 2.5MMX26MM (Screw) ×2 IMPLANT
SCREW MAX VPC 2.5X20 (Screw) IMPLANT
SCREW VPC 2.5X12 (Screw) ×2 IMPLANT
SHEET MEDIUM DRAPE 40X70 STRL (DRAPES) ×3 IMPLANT
SLEEVE SCD COMPRESS KNEE MED (MISCELLANEOUS) ×3 IMPLANT
SPLINT FAST PLASTER 5X30 (CAST SUPPLIES)
SPLINT PLASTER CAST FAST 5X30 (CAST SUPPLIES) IMPLANT
SPONGE LAP 18X18 RF (DISPOSABLE) ×3 IMPLANT
STOCKINETTE 6  STRL (DRAPES) ×3
STOCKINETTE 6 STRL (DRAPES) ×1 IMPLANT
SUCTION FRAZIER HANDLE 10FR (MISCELLANEOUS) ×3
SUCTION TUBE FRAZIER 10FR DISP (MISCELLANEOUS) ×1 IMPLANT
SUT ETHILON 3 0 PS 1 (SUTURE) ×3 IMPLANT
SUT MNCRL AB 3-0 PS2 18 (SUTURE) ×3 IMPLANT
SUT VIC AB 0 SH 27 (SUTURE) IMPLANT
SUT VIC AB 2-0 SH 27 (SUTURE) ×3
SUT VIC AB 2-0 SH 27XBRD (SUTURE) IMPLANT
SUT VICRYL 0 UR6 27IN ABS (SUTURE) IMPLANT
SYR BULB EAR ULCER 3OZ GRN STR (SYRINGE) ×3 IMPLANT
SYR CONTROL 10ML LL (SYRINGE) IMPLANT
TOWEL GREEN STERILE FF (TOWEL DISPOSABLE) ×6 IMPLANT
TUBE CONNECTING 20'X1/4 (TUBING) ×1
TUBE CONNECTING 20X1/4 (TUBING) ×1 IMPLANT
UNDERPAD 30X36 HEAVY ABSORB (UNDERPADS AND DIAPERS) ×3 IMPLANT
YANKAUER SUCT BULB TIP NO VENT (SUCTIONS) IMPLANT

## 2020-10-19 NOTE — Anesthesia Procedure Notes (Signed)
Procedure Name: LMA Insertion Date/Time: 10/19/2020 11:24 AM Performed by: Signe Colt, CRNA Pre-anesthesia Checklist: Patient identified, Emergency Drugs available, Suction available and Patient being monitored Patient Re-evaluated:Patient Re-evaluated prior to induction Oxygen Delivery Method: Circle System Utilized Preoxygenation: Pre-oxygenation with 100% oxygen Induction Type: IV induction Ventilation: Mask ventilation without difficulty LMA: LMA inserted LMA Size: 4.0 Number of attempts: 1 Airway Equipment and Method: bite block Placement Confirmation: positive ETCO2 Tube secured with: Tape Dental Injury: Teeth and Oropharynx as per pre-operative assessment

## 2020-10-19 NOTE — Anesthesia Postprocedure Evaluation (Signed)
Anesthesia Post Note  Patient: Victoria Buckley  Procedure(s) Performed: Right foot scarf and akin osteotomies, modified McBride bunionectomies (Right Foot)     Patient location during evaluation: PACU Anesthesia Type: General Level of consciousness: awake and alert Pain management: pain level controlled Vital Signs Assessment: post-procedure vital signs reviewed and stable Respiratory status: spontaneous breathing, nonlabored ventilation and respiratory function stable Cardiovascular status: blood pressure returned to baseline and stable Postop Assessment: no apparent nausea or vomiting Anesthetic complications: no   No complications documented.  Last Vitals:  Vitals:   10/19/20 1305 10/19/20 1316  BP: 92/73 129/83  Pulse: 80 81  Resp: 16 16  Temp: 36.5 C 36.6 C  SpO2: 100% 100%    Last Pain:  Vitals:   10/19/20 1316  TempSrc: Oral  PainSc: 0-No pain                 Lynda Rainwater

## 2020-10-19 NOTE — Transfer of Care (Signed)
Immediate Anesthesia Transfer of Care Note  Patient: Victoria Buckley  Procedure(s) Performed: Right foot scarf and akin osteotomies, modified McBride bunionectomies (Right Foot)  Patient Location: PACU  Anesthesia Type:GA combined with regional for post-op pain  Level of Consciousness: awake, alert , oriented and patient cooperative  Airway & Oxygen Therapy: Patient Spontanous Breathing and Patient connected to face mask oxygen  Post-op Assessment: Report given to RN and Post -op Vital signs reviewed and stable  Post vital signs: Reviewed and stable  Last Vitals:  Vitals Value Taken Time  BP    Temp    Pulse 119 10/19/20 1225  Resp    SpO2 96 % 10/19/20 1225  Vitals shown include unvalidated device data.  Last Pain:  Vitals:   10/19/20 0925  TempSrc: Oral  PainSc: 0-No pain      Patients Stated Pain Goal: 7 (18/98/42 1031)  Complications: No complications documented.

## 2020-10-19 NOTE — Progress Notes (Signed)
Assisted Dr. Sabra Heck with right, ultrasound guided, popliteal block. Side rails up, monitors on throughout procedure. See vital signs in flow sheet. Tolerated Procedure well.

## 2020-10-19 NOTE — Discharge Instructions (Addendum)
Victoria Simmer, MD [facility]  Please read the following information regarding your care after surgery.  Medications  You only need a prescription for the narcotic pain medicine (ex. oxycodone, Percocet, Norco).  All of the other medicines listed below are available over the counter. X Aleve 2 pills twice a day for the first 3 days after surgery. X acetominophen (Tylenol) 650 mg every 4-6 hours as you need for minor to moderate pain X oxycodone as prescribed for severe pain  Narcotic pain medicine (ex. oxycodone, Percocet, Vicodin) will cause constipation.  To prevent this problem, take the following medicines while you are taking any pain medicine. X docusate sodium (Colace) 100 mg twice a day X senna (Senokot) 2 tablets twice a day  Weight Bearing X Bear weight only on your operated foot (on your heel) in the post-op shoe.   Cast / Splint / Dressing X Keep your splint, cast or dressing clean and dry.  Don't put anything (coat hanger, pencil, etc) down inside of it.  If it gets damp, use a hair dryer on the cool setting to dry it.  If it gets soaked, call the office to schedule an appointment for a cast change.   After your dressing, cast or splint is removed; you may shower, but do not soak or scrub the wound.  Allow the water to run over it, and then gently pat it dry.  Swelling It is normal for you to have swelling where you had surgery.  To reduce swelling and pain, keep your toes above your nose for at least 3 days after surgery.  It may be necessary to keep your foot or leg elevated for several weeks.  If it hurts, it should be elevated.  Follow Up Call my office at [phone] when you are discharged from the hospital or surgery center to schedule an appointment to be seen two weeks after surgery.  Call my office at [phone] if you develop a fever >101.5 F, nausea, vomiting, bleeding from the surgical site or severe pain.     Post Anesthesia Home Care  Instructions  Activity: Get plenty of rest for the remainder of the day. A responsible individual must stay with you for 24 hours following the procedure.  For the next 24 hours, DO NOT: -Drive a car -Paediatric nurse -Drink alcoholic beverages -Take any medication unless instructed by your physician -Make any legal decisions or sign important papers.  Meals: Start with liquid foods such as gelatin or soup. Progress to regular foods as tolerated. Avoid greasy, spicy, heavy foods. If nausea and/or vomiting occur, drink only clear liquids until the nausea and/or vomiting subsides. Call your physician if vomiting continues.  Special Instructions/Symptoms: Your throat may feel dry or sore from the anesthesia or the breathing tube placed in your throat during surgery. If this causes discomfort, gargle with warm salt water. The discomfort should disappear within 24 hours.  If you had a scopolamine patch placed behind your ear for the management of post- operative nausea and/or vomiting:  1. The medication in the patch is effective for 72 hours, after which it should be removed.  Wrap patch in a tissue and discard in the trash. Wash hands thoroughly with soap and water. 2. You may remove the patch earlier than 72 hours if you experience unpleasant side effects which may include dry mouth, dizziness or visual disturbances. 3. Avoid touching the patch. Wash your hands with soap and water after contact with the patch.     Regional  Anesthesia Blocks  1. Numbness or the inability to move the "blocked" extremity may last from 3-48 hours after placement. The length of time depends on the medication injected and your individual response to the medication. If the numbness is not going away after 48 hours, call your surgeon.  2. The extremity that is blocked will need to be protected until the numbness is gone and the  Strength has returned. Because you cannot feel it, you will need to take extra care to  avoid injury. Because it may be weak, you may have difficulty moving it or using it. You may not know what position it is in without looking at it while the block is in effect.  3. For blocks in the legs and feet, returning to weight bearing and walking needs to be done carefully. You will need to wait until the numbness is entirely gone and the strength has returned. You should be able to move your leg and foot normally before you try and bear weight or walk. You will need someone to be with you when you first try to ensure you do not fall and possibly risk injury.  4. Bruising and tenderness at the needle site are common side effects and will resolve in a few days.  5. Persistent numbness or new problems with movement should be communicated to the surgeon or the [facility] [phone] [place] [phone]).

## 2020-10-19 NOTE — Anesthesia Procedure Notes (Signed)
Anesthesia Regional Block: Popliteal block   Pre-Anesthetic Checklist: ,, timeout performed, Correct Patient, Correct Site, Correct Laterality, Correct Procedure, Correct Position, site marked, Risks and benefits discussed,  Surgical consent,  Pre-op evaluation,  At surgeon's request and post-op pain management  Laterality: Right  Prep: chloraprep       Needles:  Injection technique: Single-shot  Needle Type: Stimiplex     Needle Length: 9cm  Needle Gauge: 21     Additional Needles:   Procedures:,,,, ultrasound used (permanent image in chart),,,,  Narrative:  Start time: 10/19/2020 10:23 AM End time: 10/19/2020 10:28 AM Injection made incrementally with aspirations every 5 mL.  Performed by: Personally  Anesthesiologist: Lynda Rainwater, MD

## 2020-10-19 NOTE — H&P (Signed)
Victoria Buckley is an 39 y.o. female.   Chief Complaint: Right foot pain HPI: The patient is a 39 year old female without significant past medical history.  She has a long history of right forefoot pain due to a prominent bunion deformity.  She has failed nonoperative treatment to date including activity modification, oral anti-inflammatories and shoewear modification.  She presents now for surgical correction of this painful right forefoot deformity.  Past Medical History:  Diagnosis Date  . Allergy   . Anxiety   . Chicken pox   . Depression   . GERD (gastroesophageal reflux disease)   . Migraines   . Miscarriage    x3  . Nausea   . OCD (obsessive compulsive disorder)   . PONV (postoperative nausea and vomiting)   . 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 Vitamin D deficiency     Past Surgical History:  Procedure Laterality Date  . BUNIONECTOMY  1998  . DILATION AND CURETTAGE OF UTERUS  2006    Family History  Problem Relation Age of Onset  . Osteoporosis Mother   . Scoliosis Mother   . Arthritis Mother   . Depression Mother   . Hyperlipidemia Mother   . Stroke Maternal Grandmother   . Arthritis Maternal Grandmother   . Dementia Maternal Grandfather   . Heart disease Maternal Grandfather   . Hypertension Maternal Grandfather   . Hyperlipidemia Maternal Grandfather   . Hyperlipidemia Paternal Grandmother   . Kidney disease Paternal Grandmother   . Diabetes Paternal Grandmother   . Asthma Paternal Grandfather   . COPD Paternal Grandfather   . Colon cancer Neg Hx   . Esophageal cancer Neg Hx    Social History:  reports that she has never smoked. She has never used smokeless tobacco. She reports current alcohol use. She reports that she does not use drugs.  Allergies:  Allergies  Allergen Reactions  . Citalopram Hives, Itching, Nausea Only and Shortness Of Breath  . Zoloft [Sertraline Hcl]     Hand tremor     Medications Prior to Admission  Medication Sig  Dispense Refill  . buPROPion (WELLBUTRIN XL) 150 MG 24 hr tablet Take 1 tablet (150 mg total) by mouth daily. 90 tablet 3  . Butalbital-APAP-Caffeine 50-300-40 MG CAPS Take 1-2 tablets by mouth 2 (two) times daily as needed (for migraines). Do not take more than 6 pills in 24 hours 60 capsule 2  . cetirizine (ZYRTEC) 10 MG tablet Take 1 tablet (10 mg total) by mouth daily as needed for allergies. 90 tablet 3  . Cholecalciferol 1.25 MG (50000 UT) capsule Take 1 capsule (50,000 Units total) by mouth once a week. 13 capsule 1  . hydrOXYzine (ATARAX/VISTARIL) 25 MG tablet Take 1-2 tablets (25-50 mg total) by mouth 2 (two) times daily as needed. Or qhs prn 60 tablet 2  . montelukast (SINGULAIR) 10 MG tablet Take 10 mg by mouth at bedtime.    . ondansetron (ZOFRAN) 4 MG tablet Take 1 tablet (4 mg total) by mouth every 8 (eight) hours as needed for nausea or vomiting. 90 tablet 0    Results for orders placed or performed during the hospital encounter of 10/19/20 (from the past 48 hour(s))  Pregnancy, urine POC     Status: None   Collection Time: 10/19/20  9:34 AM  Result Value Ref Range   Preg Test, Ur NEGATIVE NEGATIVE    Comment:        THE SENSITIVITY  OF THIS METHODOLOGY IS >24 mIU/mL    No results found.  Review of Systems no recent fever, chills, nausea, vomiting or changes in her appetite  Blood pressure 101/71, pulse 72, temperature 97.8 F (36.6 C), temperature source Oral, resp. rate 18, height 5\' 4"  (1.626 m), weight 53.4 kg, last menstrual period 10/12/2020, SpO2 100 %. Physical Exam  Well-nourished well-developed woman in no apparent distress.  Alert and oriented x4.  Normal mood and affect.  Gait is normal.  The right foot has a prominent bunion deformity.  Skin is healthy and intact.  Pulses are palpable.  No lymphadenopathy.  No instability at the first TMT joint.   Assessment/Plan Right foot bunion deformity -to the operating room today for first metatarsal scarf osteotomy,  modified McBride bunionectomy and Akin osteotomy of the proximal phalanx.  The risks and benefits of the alternative treatment options have been discussed in detail.  The patient wishes to proceed with surgery and specifically understands risks of bleeding, infection, nerve damage, blood clots, need for additional surgery, amputation and death.   Wylene Simmer, MD November 14, 2020, 11:01 AM

## 2020-10-19 NOTE — Anesthesia Preprocedure Evaluation (Signed)
Anesthesia Evaluation  Patient identified by MRN, date of birth, ID band Patient awake    Reviewed: Allergy & Precautions, NPO status , Patient's Chart, lab work & pertinent test results  History of Anesthesia Complications (+) PONV  Airway Mallampati: II  TM Distance: >3 FB Neck ROM: Full    Dental no notable dental hx.    Pulmonary neg pulmonary ROS,    Pulmonary exam normal breath sounds clear to auscultation       Cardiovascular negative cardio ROS Normal cardiovascular exam Rhythm:Regular Rate:Normal     Neuro/Psych  Headaches, Anxiety Depression negative psych ROS   GI/Hepatic Neg liver ROS, GERD  ,  Endo/Other  negative endocrine ROS  Renal/GU negative Renal ROS  negative genitourinary   Musculoskeletal negative musculoskeletal ROS (+)   Abdominal   Peds negative pediatric ROS (+)  Hematology negative hematology ROS (+)   Anesthesia Other Findings   Reproductive/Obstetrics negative OB ROS                             Anesthesia Physical Anesthesia Plan  ASA: II  Anesthesia Plan: General   Post-op Pain Management:    Induction: Intravenous  PONV Risk Score and Plan: 4 or greater and Ondansetron, Dexamethasone, Midazolam, Droperidol and Treatment may vary due to age or medical condition  Airway Management Planned: LMA  Additional Equipment:   Intra-op Plan:   Post-operative Plan: Extubation in OR  Informed Consent: I have reviewed the patients History and Physical, chart, labs and discussed the procedure including the risks, benefits and alternatives for the proposed anesthesia with the patient or authorized representative who has indicated his/her understanding and acceptance.     Dental advisory given  Plan Discussed with: CRNA  Anesthesia Plan Comments:         Anesthesia Quick Evaluation

## 2020-10-19 NOTE — Op Note (Signed)
10/19/2020  12:27 PM  PATIENT:  Victoria Buckley  39 y.o. female  PRE-OPERATIVE DIAGNOSIS:  right foot bunion  POST-OPERATIVE DIAGNOSIS:  right foot bunion  Procedure(s): 1.  Right forefoot modified McBride bunionectomy 2.  Right first metatarsal scarf osteotomy 3.  Right hallux proximal phalanx Akin osteotomy 4.  AP and lateral radiographs of the right foot  SURGEON:  Wylene Simmer, MD  ASSISTANT: Mechele Claude, PA-C  ANESTHESIA:   General, regional  EBL:  minimal   TOURNIQUET:   Total Tourniquet Time Documented: Thigh (Right) - 42 minutes Total: Thigh (Right) - 42 minutes  COMPLICATIONS:  None apparent  DISPOSITION:  Extubated, awake and stable to recovery.  INDICATION FOR PROCEDURE: The patient is a 39 year old female without significant past medical history.  She has a painful bunion deformity of the right forefoot that has been bothersome for many years.  She has failed nonoperative treatment to date including activity modification, oral anti-inflammatories and shoewear modification.  She presents now for surgical correction of this painful forefoot condition.  The risks and benefits of the alternative treatment options have been discussed in detail.  The patient wishes to proceed with surgery and specifically understands risks of bleeding, infection, nerve damage, blood clots, need for additional surgery, amputation and death.  PROCEDURE IN DETAIL:  After pre operative consent was obtained, and the correct operative site was identified, the patient was brought to the operating room and placed supine on the OR table.  Anesthesia was administered.  Pre-operative antibiotics were administered.  A surgical timeout was taken.  The right lower extremity was prepped and draped in standard sterile fashion with a tourniquet around the thigh.  The extremity was elevated and the tourniquet was inflated to 250 mmHg.  A longitudinal incision was made at the dorsum of the first webspace.   Dissection was carried down through the subcutaneous tissues.  The intermetatarsal ligament was divided under direct vision.  An arthrotomy was then made between the lateral sesamoid and the first metatarsal head.  Small perforations were made in the lateral joint capsule.  The hallux could then be positioned in slight varus passively.  Attention was turned to the medial forefoot where a longitudinal incision was made.  Dissection was carried down through the subcutaneous tissues.  The medial joint capsule was incised and elevated dorsally and plantarly.  The hypertrophic medial eminence was resected in line with the first metatarsal shaft.  A scarf osteotomy was then cut in the metatarsal.  The osteotomy was mobilized and the head of the metatarsal translated laterally correcting the hallux valgus and intermetatarsal angles.  The osteotomy was then fixed with 2 2.5 mm [employer] VPC screws.  Overhanging bone was trimmed with a saw.  AP and lateral radiographs showed appropriate correction of the bunion deformity.  The patient had a bit of residual hallux valgus interphalangeus.  Dissection was carried distally along the proximal phalanx.  The flexor and extensor tendons were protected.  A closing wedge osteotomy was made at the medial proximal phalanx base.  The osteotomy was closed and fixed with a 2.5 mm VPC screw.  AP and lateral radiographs of the right foot showed interval correction of the bunion deformity and appropriate position and length of all hardware.  The wound was irrigated copiously.  Vancomycin powder were sprinkled in the wound.  Medial joint capsule was repaired with Vicryl.  Subcutaneous tissues were approximated with Vicryl.  Skin incisions were closed with nylon.  Sterile dressings were applied followed  by a bunion wrap.  The tourniquet was released after application of the dressings.  The patient was awakened from anesthesia and transported to the recovery room in stable  condition.  FOLLOW UP PLAN: Weightbearing as tolerated in a postop shoe.  Follow-up in the office in 2 weeks for suture removal.  Plan 6 weeks weightbearing immobilization in the postop shoe.  No toe spacer.   RADIOGRAPHS: AP and lateral radiographs of the right foot are obtained intraoperatively.  These show interval correction of the bunion deformity with proximal phalanx and first metatarsal osteotomies.  Hardware is appropriately positioned and of the appropriate lengths.    Mechele Claude PA-C was present and scrubbed for the duration of the operative case. His assistance was essential in positioning the patient, prepping and draping, gaining and maintaining exposure, performing the operation, closing and dressing the wounds and applying the splint.

## 2020-10-20 ENCOUNTER — Encounter (HOSPITAL_BASED_OUTPATIENT_CLINIC_OR_DEPARTMENT_OTHER): Payer: Self-pay | Admitting: Orthopedic Surgery

## 2020-12-21 ENCOUNTER — Telehealth: Payer: Self-pay | Admitting: Neurology

## 2020-12-21 NOTE — Telephone Encounter (Signed)
With staff shortages and closures for weather it is difficult to get people in immediately and our practice is not set up as an urgent care we cannot provide that type of service. Unfortunately if this is acute and she can't feel her face please tell her she has to go to the emergency room as we cannot rule out a stroke or something very serious. I will ask Romelle Starcher to call.

## 2020-12-21 NOTE — Telephone Encounter (Signed)
I called the patient and LVM (ok per DPR) advising per Dr Jaynee Eagles the patient needs to be seen by urgent care or ER, rule out infection of sinus, tooth, etc. We will try to get her in as soon as possible but we are not able to urgently evaluate and this could be a primary care situation that should be handled by urgent care, etc. Pt should let us know who she sees and what they say. I left the office number for patient to call me back.

## 2020-12-21 NOTE — Telephone Encounter (Signed)
We received a referral for this patient from [facility] for facial numbness on 12/21/20, I scheduled her for our next available (02/20/21) and she expressed concern that she needed to be seen sooner since she "can't feel her face". I had her referral reviewed by Dr. Jaynee Eagles who stated she needs to be evaluated for this issue by her primary care provider first since she has not been evaluated by [facility]-the referral was based off of a message sent by patient. I called the patient to let her know we will keep her appointment in March for now and that she does need to see her Primary Care Provider in the meantime. Patient got very irritated and expressed that she does not have a primary care and it will take just as long to establish care there as it will here. I advised her to look for offices accepting new patients and that it typically does not take more than 2 weeks to get an appt. She hung up on me.

## 2021-01-10 ENCOUNTER — Other Ambulatory Visit: Payer: Self-pay

## 2021-01-10 ENCOUNTER — Ambulatory Visit
Admission: RE | Admit: 2021-01-10 | Discharge: 2021-01-10 | Disposition: A | Discharge status: Home / Self Care | Payer: BC Managed Care – PPO | Source: Ambulatory Visit | Attending: Family Medicine | Admitting: Family Medicine

## 2021-01-10 VITALS — BP 155/93 | HR 91 | Temp 99.3°F | Resp 18

## 2021-01-10 DIAGNOSIS — M792 Neuralgia and neuritis, unspecified: Secondary | ICD-10-CM

## 2021-01-10 MED ORDER — PREDNISONE 10 MG (21) PO TBPK: ORAL_TABLET | Freq: Every day | ORAL | 0 refills | Status: AC

## 2021-01-10 NOTE — Discharge Instructions (Signed)
I have sent in a prednisone taper for you to take for 6 days. 6 tablets on day one, 5 tablets on day two, 4 tablets on day three, 3 tablets on day four, 2 tablets on day five, and 1 tablet on day six.  Follow up with this office or with primary care if symptoms are persisting.  Follow up in the ER for high fever, trouble swallowing, trouble breathing, other concerning symptoms.   

## 2021-01-10 NOTE — ED Triage Notes (Signed)
Pt presents with c/o left side facial numbness for past month and then developed tingling in right fingers on Monday. Pt has neuro consult in March

## 2021-01-15 NOTE — ED Provider Notes (Signed)
[name]    CSN: [id number] Arrival date & time: 01/10/21  1842      History   Chief Complaint Chief Complaint  Patient presents with  . Numbness    HPI Victoria Buckley is a 40 y.o. female.   Reports that she has been having intermittent left sided facial numbness x the last month. Reports that she is recently having numbness and tingling in her right fingers for the last 2 days. Has been using crutches for the last 2 mos from a foot surgery. States that she needs to be seen to be sure that she is not experiencing Covid like symptoms. Denies cough, headache, sneezing, nausea, vomiting, diarrhea, rash, fever, other symptoms. States that she has a neurology consult in March.  ROS per HPI  The history is provided by the patient.    Past Medical History:  Diagnosis Date  . Allergy   . Anxiety   . Chicken pox   . Depression   . GERD (gastroesophageal reflux disease)   . Migraines   . Miscarriage    x3  . Nausea   . OCD (obsessive compulsive disorder)   . PONV (postoperative nausea and vomiting)   . 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 Vitamin D deficiency     Patient Active Problem List   Diagnosis Date Noted  . Allergic rhinitis 06/17/2016  . Family planning 06/17/2016  . Clinical depression 06/17/2016  . 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 Fatigue 06/17/2016  . Benign melanoma 06/17/2016  . 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 06/17/2016  . Ear drum perforation 06/17/2016  . Vitamin D deficiency 06/17/2016  . Anxiety and depression 08/25/2009  . Candidal vulvovaginitis 06/01/2009  . Intractable migraine without aura with status migrainosus 06/27/2006    Past Surgical History:  Procedure Laterality Date  . BUNIONECTOMY  1998  . BUNIONECTOMY Right 10/19/2020   Procedure: Right foot scarf and akin osteotomies, modified Oliva Bustard;  Surgeon: Wylene Simmer, MD;  Location: [facility];  Service: Orthopedics;   Laterality: Right;  . DILATION AND CURETTAGE OF UTERUS  2006    OB History   No obstetric history on file.      Home Medications    Prior to Admission medications   Medication Sig Start Date End Date Taking? Authorizing Provider  predniSONE (STERAPRED UNI-PAK 21 TAB) 10 MG (21) TBPK tablet Take by mouth daily for 6 days. Take 6 tablets on day 1, 5 tablets on day 2, 4 tablets on day 3, 3 tablets on day 4, 2 tablets on day 5, 1 tablet on day 6 01/10/21 01/16/21 Yes Faustino Congress, NP  buPROPion (WELLBUTRIN XL) 150 MG 24 hr tablet Take 1 tablet (150 mg total) by mouth daily. 10/13/19   McLean-Scocuzza, Nino Glow, MD  Butalbital-APAP-Caffeine 50-300-40 MG CAPS Take 1-2 tablets by mouth 2 (two) times daily as needed (for migraines). Do not take more than 6 pills in 24 hours 10/05/19   McLean-Scocuzza, Nino Glow, MD  cetirizine (ZYRTEC) 10 MG tablet Take 1 tablet (10 mg total) by mouth daily as needed for allergies. 10/13/19   McLean-Scocuzza, Nino Glow, MD  Cholecalciferol 1.25 MG (50000 UT) capsule Take 1 capsule (50,000 Units total) by mouth once a week. 10/24/19   McLean-Scocuzza, Nino Glow, MD  docusate sodium (COLACE) 100 MG capsule Take 1 capsule (100 mg total) by mouth 2 (two) times daily. While taking narcotic pain medicine. 10/19/20   Corky Sing,  PA-C  hydrOXYzine (ATARAX/VISTARIL) 25 MG tablet Take 1-2 tablets (25-50 mg total) by mouth 2 (two) times daily as needed. Or qhs prn 10/13/19   McLean-Scocuzza, Nino Glow, MD  montelukast (SINGULAIR) 10 MG tablet Take 10 mg by mouth at bedtime.    [provider]  ondansetron (ZOFRAN) 4 MG tablet Take 1 tablet (4 mg total) by mouth every 8 (eight) hours as needed for nausea or vomiting. 10/13/19   McLean-Scocuzza, Nino Glow, MD  senna (SENOKOT) 8.6 MG TABS tablet Take 2 tablets (17.2 mg total) by mouth 2 (two) times daily. 10/19/20   Corky Sing, PA-C    Family History Family History  Problem Relation Age of Onset  .  Osteoporosis Mother   . Scoliosis Mother   . Arthritis Mother   . Depression Mother   . Hyperlipidemia Mother   . Stroke Maternal Grandmother   . Arthritis Maternal Grandmother   . Dementia Maternal Grandfather   . Heart disease Maternal Grandfather   . Hypertension Maternal Grandfather   . Hyperlipidemia Maternal Grandfather   . Hyperlipidemia Paternal Grandmother   . Kidney disease Paternal Grandmother   . Diabetes Paternal Grandmother   . Asthma Paternal Grandfather   . COPD Paternal Grandfather   . Colon cancer Neg Hx   . Esophageal cancer Neg Hx     Social History Social History   Tobacco Use  . Smoking status: Never Smoker  . Smokeless tobacco: Never Used  Vaping Use  . Vaping Use: Never used  Substance Use Topics  . Alcohol use: Yes    Alcohol/week: 0.0 standard drinks    Comment: occas  . Drug use: Never     Allergies   Citalopram and Zoloft [sertraline hcl]   Review of Systems Review of Systems   Physical Exam Triage Vital Signs ED Triage Vitals  Enc Vitals Group     BP 01/10/21 1908 (!) 155/93     Pulse Rate 01/10/21 1908 91     Resp 01/10/21 1908 18     Temp 01/10/21 1908 99.3 F (37.4 C)     Temp src --      SpO2 01/10/21 1908 97 %     Weight --      Height --      Head Circumference --      Peak Flow --      Pain Score 01/10/21 1905 0     Pain Loc --      Pain Edu? --      Excl. in [place]? --    No data found.  Updated Vital Signs BP (!) 155/93   Pulse 91   Temp 99.3 F (37.4 C)   Resp 18   LMP 12/24/2020   SpO2 97%   Visual Acuity Right Eye Distance:   Left Eye Distance:   Bilateral Distance:    Right Eye Near:   Left Eye Near:    Bilateral Near:     Physical Exam Vitals and nursing note reviewed.  Constitutional:      General: She is not in acute distress.    Appearance: Normal appearance. She is well-developed and well-nourished. She is not ill-appearing.  HENT:     Head: Normocephalic and atraumatic.     Right  Ear: Tympanic membrane, ear canal and external ear normal.     Left Ear: Tympanic membrane, ear canal and external ear normal.     Nose: Nose normal.     Mouth/Throat:  Mouth: Mucous membranes are moist.     Pharynx: Oropharynx is clear.  Eyes:     Extraocular Movements: Extraocular movements intact.     Conjunctiva/sclera: Conjunctivae normal.     Pupils: Pupils are equal, round, and reactive to light.  Cardiovascular:     Rate and Rhythm: Normal rate and regular rhythm.     Heart sounds: Normal heart sounds. No murmur heard.   Pulmonary:     Effort: Pulmonary effort is normal. No respiratory distress.     Breath sounds: Normal breath sounds. No stridor. No wheezing, rhonchi or rales.  Chest:     Chest wall: No tenderness.  Abdominal:     Palpations: Abdomen is soft.     Tenderness: There is no abdominal tenderness.  Musculoskeletal:        General: No edema.     Cervical back: Normal range of motion and neck supple.     Comments: Using crutches  Skin:    General: Skin is warm and dry.     Capillary Refill: Capillary refill takes less than 2 seconds.  Neurological:     Mental Status: She is alert and oriented to person, place, and time. Mental status is at baseline.     Cranial Nerves: No cranial nerve deficit.     Sensory: Sensory deficit present.     Motor: No weakness.     Coordination: Coordination normal.     Gait: Gait normal.     Deep Tendon Reflexes: Reflexes normal.  Psychiatric:        Mood and Affect: Mood and affect and mood normal.        Behavior: Behavior normal.        Thought Content: Thought content normal.      UC Treatments / Results  Labs (all labs ordered are listed, but only abnormal results are displayed) Labs Reviewed - No data to display  EKG   Radiology No results found.  Procedures Procedures (including critical care time)  Medications Ordered in UC Medications - No data to display  Initial Impression / Assessment and Plan  / UC Course  I have reviewed the triage vital signs and the nursing notes.  Pertinent labs & imaging results that were available during my care of the patient were reviewed by me and considered in my medical decision making (see chart for details).    Neuralgia  Prescribed steroid taper to help with neuralgia  Discussed with patient that this could be coming from use of crutches and leaning herself on the crutch under her R arm, will try to modify to proper body mechanics when using crutches Follow up with neuro in March Follow up as needed  Final Clinical Impressions(s) / UC Diagnoses   Final diagnoses:  Neuralgia     Discharge Instructions     I have sent in a prednisone taper for you to take for 6 days. 6 tablets on day one, 5 tablets on day two, 4 tablets on day three, 3 tablets on day four, 2 tablets on day five, and 1 tablet on day six.  Follow up with this office or with primary care if symptoms are persisting.  Follow up in the ER for high fever, trouble swallowing, trouble breathing, other concerning symptoms.     ED Prescriptions    Medication Sig Dispense Auth. Provider   predniSONE (STERAPRED UNI-PAK 21 TAB) 10 MG (21) TBPK tablet Take by mouth daily for 6 days. Take 6 tablets on day 1, 5  tablets on day 2, 4 tablets on day 3, 3 tablets on day 4, 2 tablets on day 5, 1 tablet on day 6 21 tablet Faustino Congress, NP     PDMP not reviewed this encounter.   Faustino Congress, NP 01/15/21 1032

## 2021-02-02 DIAGNOSIS — G5621 Lesion of ulnar nerve, right upper limb: Secondary | ICD-10-CM | POA: Diagnosis not present

## 2021-02-02 DIAGNOSIS — M21611 Bunion of right foot: Secondary | ICD-10-CM | POA: Diagnosis not present

## 2021-02-09 DIAGNOSIS — M25671 Stiffness of right ankle, not elsewhere classified: Secondary | ICD-10-CM | POA: Diagnosis not present

## 2021-02-09 DIAGNOSIS — M25571 Pain in right ankle and joints of right foot: Secondary | ICD-10-CM | POA: Diagnosis not present

## 2021-02-19 ENCOUNTER — Encounter: Payer: Self-pay | Admitting: *Deleted

## 2021-02-20 ENCOUNTER — Ambulatory Visit (INDEPENDENT_AMBULATORY_CARE_PROVIDER_SITE_OTHER): Payer: BC Managed Care – PPO | Admitting: Neurology

## 2021-02-20 ENCOUNTER — Other Ambulatory Visit: Payer: Self-pay

## 2021-02-20 ENCOUNTER — Telehealth: Payer: Self-pay | Admitting: Neurology

## 2021-02-20 ENCOUNTER — Encounter: Payer: Self-pay | Admitting: Neurology

## 2021-02-20 VITALS — BP 124/78 | HR 76 | Ht 64.0 in | Wt 121.0 lb

## 2021-02-20 DIAGNOSIS — R202 Paresthesia of skin: Secondary | ICD-10-CM

## 2021-02-20 DIAGNOSIS — R200 Anesthesia of skin: Secondary | ICD-10-CM | POA: Diagnosis not present

## 2021-02-20 DIAGNOSIS — R799 Abnormal finding of blood chemistry, unspecified: Secondary | ICD-10-CM | POA: Diagnosis not present

## 2021-02-20 DIAGNOSIS — R29898 Other symptoms and signs involving the musculoskeletal system: Secondary | ICD-10-CM | POA: Diagnosis not present

## 2021-02-20 DIAGNOSIS — E079 Disorder of thyroid, unspecified: Secondary | ICD-10-CM | POA: Diagnosis not present

## 2021-02-20 NOTE — Progress Notes (Signed)
[facility]    Provider:  Dr Jaynee Eagles Requesting Provider: Corky Sing, PA-C Primary Care Provider:  Patient, No Pcp Per  CC:  numbness  HPI:  Victoria Buckley is a 40 y.o. female here as requested by Corky Sing, PA-C for facial numbness. She developed facial numbness after foot surgery in November. Her right hand started tingling. If no neuro causes, ortho is planning an emg/ncs. PMHx migraines. She had foot surgery, right foot, she still had some swelling, she was placed on Gabapentin she took it 4 days and she woke up one morning the following week and noticed when she scratched her face perfectly midline the left side of the face including the forehead, nose lips, even her teeth, left side of the scalp more frontal and parietal not occipital. Persistent daily, varying, scratching noticed it more, it still continues but improved, acute onset, got worse over a period of weeks and then improved but still symptomatic then the right hand numbness started.   Reviewed notes, labs and imaging from outside physicians, which showed:  Reviewed ED notes, she was seen and thought that her right hand was due to the crutches, she was evaluated and discharged. 1  hgba1c 5.5, tsh nml 10/2019, cbc/cmp normal  Review of Systems: Patient complains of symptoms per HPI as well as the following symptoms: numbness. Pertinent negatives and positives per HPI. All others negative.   Social History   Socioeconomic History  . Marital status: Single    Spouse name: Not on file  . Number of children: Not on file  . Years of education: Not on file  . Highest education level: Not on file  Occupational History  . Occupation: CMA  Tobacco Use  . Smoking status: Never Smoker  . Smokeless tobacco: Never Used  Vaping Use  . Vaping Use: Never used  Substance and Sexual Activity  . Alcohol use: Yes    Alcohol/week: 0.0 standard drinks    Comment: occas  . Drug use: Never  .  Sexual activity: Yes  Other Topics Concern  . 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 Osteoporosis Mother   . Scoliosis Mother   . Arthritis Mother   . Depression Mother   . Hyperlipidemia Mother   . Migraines Mother   . ADD / ADHD Mother        "adult ADHD"  . Stroke Maternal Grandmother   . Arthritis Maternal Grandmother   . Dementia Maternal Grandfather   . Heart disease Maternal Grandfather   . Hypertension Maternal Grandfather   . Hyperlipidemia Maternal Grandfather   . Hyperlipidemia Paternal Grandmother   . Kidney disease Paternal Grandmother   . Diabetes Paternal Grandmother   . Asthma Paternal Grandfather   .  COPD Paternal Grandfather   . Colon cancer Neg Hx   . Esophageal cancer Neg Hx     Past Medical History:  Diagnosis Date  . Allergy   . Anxiety   . Chicken pox   . Depression   . GERD (gastroesophageal reflux disease)   . Joint pain   . Migraines   . Miscarriage    x3  . Nausea   . OCD (obsessive compulsive disorder)   . PONV (postoperative nausea and vomiting)   . 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 Vitamin D deficiency     Patient Active Problem List   Diagnosis Date Noted  . Allergic rhinitis 06/17/2016  . Family planning 06/17/2016  . Clinical depression 06/17/2016  . 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 Fatigue 06/17/2016  . Benign melanoma 06/17/2016  . Nonspecific  reaction to tuberculin skin test 06/17/2016  . Ear drum perforation 06/17/2016  . Vitamin D deficiency 06/17/2016  . Anxiety and depression 08/25/2009  . Candidal vulvovaginitis 06/01/2009  . Intractable migraine without aura with status migrainosus 06/27/2006    Past Surgical History:  Procedure Laterality Date  . BUNIONECTOMY  1998  . BUNIONECTOMY Right 10/19/2020   Procedure: Right foot scarf and akin osteotomies, modified Oliva Bustard;  Surgeon: Wylene Simmer, MD;  Location: [facility];  Service: Orthopedics;  Laterality: Right;  . DILATION AND CURETTAGE OF UTERUS  2006    Current Outpatient Medications  Medication Sig Dispense Refill  . Butalbital-APAP-Caffeine 50-300-40 MG CAPS Take 1-2 tablets by mouth 2 (two) times daily as needed (for migraines). Do not take more than 6 pills in 24 hours 60 capsule 2  . cetirizine (ZYRTEC) 10 MG tablet Take 1 tablet (10 mg total) by mouth daily as needed for allergies. 90 tablet 3  . Cholecalciferol 1.25 MG (50000 UT) capsule Take 1 capsule (50,000 Units total) by mouth once a week. 13 capsule 1  . gabapentin (NEURONTIN) 100 MG capsule Take 100 mg by mouth in the morning, at noon, and at bedtime.    . hydrOXYzine (ATARAX/VISTARIL) 25 MG tablet Take 1-2 tablets (25-50 mg total) by mouth 2 (two) times daily as needed. Or qhs prn 60 tablet 2  . montelukast (SINGULAIR) 10 MG tablet Take 10 mg by mouth at bedtime.    . ondansetron (ZOFRAN) 4 MG tablet Take 1 tablet (4 mg total) by mouth every 8 (eight) hours as needed for nausea or vomiting. 90 tablet 0   No current facility-administered medications for this visit.    Allergies as of 02/20/2021 - Review Complete 02/20/2021  Allergen Reaction Noted  . Citalopram Hives, Itching, Nausea Only, and Shortness Of Breath 06/17/2016  . Zoloft [sertraline hcl]  10/05/2019    Vitals: BP 124/78 (BP Location: Right Arm, Patient Position: Sitting)   Pulse 76   Ht 5\' 4"  (1.626 m)    Wt 121 lb (54.9 kg)   BMI 20.77 kg/m  Last Weight:  Wt Readings from Last 1 Encounters:  02/20/21 121 lb (54.9 kg)   Last Height:   Ht Readings from Last 1 Encounters:  02/20/21 5\' 4"  (1.626 m)     Physical exam: Exam: Gen: NAD, conversant, well nourised, obese, well groomed                     CV: RRR, no MRG. No Carotid Bruits. No peripheral edema, warm, nontender Eyes: Conjunctivae clear without exudates or hemorrhage  Neuro: Detailed Neurologic Exam  Speech:    Speech is normal; fluent and spontaneous with normal comprehension.  Cognition:    The patient is oriented to person, place, and time;     recent and remote memory intact;     language fluent;     normal attention, concentration,     fund of knowledge Cranial Nerves:    The pupils are equal, round, and reactive to light. The fundi are normal and spontaneous venous pulsations are present. Visual fields are full to finger confrontation. Extraocular movements are intact. Trigeminal sensation is intact and the muscles of mastication are normal. The face is symmetric. The palate elevates in the midline. Hearing intact. Voice is normal. Shoulder shrug is normal. The tongue has normal motion without fasciculations.   Coordination:    Normal finger to nose .   Gait:    Gait normal  Motor Observation:    No asymmetry, no atrophy, and no involuntary movements noted. Tone:    Normal muscle tone.    Posture:    Posture is normal. normal erect    Strength: right hand xtensor mild weakness otherwisestrength is V/V in the upper and lower limbs.      Sensation: intact to LT     Reflex Exam:  DTR's:    Deep tendon reflexes in the upper and lower extremities are normal bilaterally.   Toes:    The toes are downgoing bilaterally.   Clonus:    Clonus is absent.    Assessment/Plan: This is a 40 year old female with an unusual history of left-sided facial numbness that started acutely, worsened to a nadir, then slowly  improved but still persistent, also with new right hand numbness and tingling and weakness, it is concerning for demyelinating disease or MS in this patient we need to evaluate MRI of the brain and the cervical spine.   Orders Placed This Encounter  Procedures  . MR BRAIN W WO CONTRAST  . MR CERVICAL SPINE W WO CONTRAST  . CBC  . Comprehensive metabolic panel  . TSH   No orders of the defined types were placed in this encounter.   Cc: Corky Sing, PA-C,  Patient, No Pcp Per  Sarina Ill, MD  [facility] [address] [place], [place] [address]  Phone [phone] Fax [phone]

## 2021-02-20 NOTE — Telephone Encounter (Signed)
LVM asking pt to call back to schedule MRI.  BCBS # [id number] (exp/ 02/20/21-08/18/21)

## 2021-02-20 NOTE — Patient Instructions (Signed)
MRI brain and cervical spine bloodwork

## 2021-02-21 LAB — COMPREHENSIVE METABOLIC PANEL
ALT: 13 IU/L (ref 0–32)
AST: 13 IU/L (ref 0–40)
Albumin/Globulin Ratio: 2 (ref 1.2–2.2)
Albumin: 4.4 g/dL (ref 3.8–4.8)
Alkaline Phosphatase: 76 IU/L (ref 44–121)
BUN/Creatinine Ratio: 11 (ref 9–23)
BUN: 8 mg/dL (ref 6–24)
Bilirubin Total: 0.3 mg/dL (ref 0.0–1.2)
CO2: 23 mmol/L (ref 20–29)
Calcium: 9.4 mg/dL (ref 8.7–10.2)
Chloride: 99 mmol/L (ref 96–106)
Creatinine, Ser: 0.7 mg/dL (ref 0.57–1.00)
Globulin, Total: 2.2 g/dL (ref 1.5–4.5)
Glucose: 102 mg/dL — ABNORMAL HIGH (ref 65–99)
Potassium: 4.4 mmol/L (ref 3.5–5.2)
Sodium: 137 mmol/L (ref 134–144)
Total Protein: 6.6 g/dL (ref 6.0–8.5)
eGFR: 111 mL/min/{1.73_m2} (ref >59)

## 2021-02-21 LAB — CBC
Hematocrit: 40.1 % (ref 34.0–46.6)
Hemoglobin: 13.3 g/dL (ref 11.1–15.9)
MCH: 32.4 pg (ref 26.6–33.0)
MCHC: 33.2 g/dL (ref 31.5–35.7)
MCV: 98 fL — ABNORMAL HIGH (ref 79–97)
Platelets: 263 10*3/uL (ref 150–450)
RBC: 4.1 x10E6/uL (ref 3.77–5.28)
RDW: 11.8 % (ref 11.7–15.4)
WBC: 12 10*3/uL — ABNORMAL HIGH (ref 3.4–10.8)

## 2021-02-21 LAB — TSH: TSH: 0.969 u[IU]/mL (ref 0.450–4.500)

## 2021-02-22 NOTE — Telephone Encounter (Signed)
x2 left voicemail.

## 2021-02-26 NOTE — Telephone Encounter (Signed)
Patient returned my call she is scheduled at [facility] for 03/07/21.

## 2021-02-26 NOTE — Telephone Encounter (Signed)
x3 LVM

## 2021-03-07 ENCOUNTER — Other Ambulatory Visit: Payer: Self-pay

## 2021-03-07 ENCOUNTER — Ambulatory Visit (INDEPENDENT_AMBULATORY_CARE_PROVIDER_SITE_OTHER): Payer: BC Managed Care – PPO

## 2021-03-07 DIAGNOSIS — R29898 Other symptoms and signs involving the musculoskeletal system: Secondary | ICD-10-CM | POA: Diagnosis not present

## 2021-03-07 DIAGNOSIS — R200 Anesthesia of skin: Secondary | ICD-10-CM

## 2021-03-07 DIAGNOSIS — R202 Paresthesia of skin: Secondary | ICD-10-CM | POA: Diagnosis not present

## 2021-03-07 MED ORDER — GADOBENATE DIMEGLUMINE 529 MG/ML IV SOLN
10.0000 mL | Freq: Once | INTRAVENOUS | Status: AC | PRN
  Administered 2021-03-07: 10 mL via INTRAVENOUS

## 2021-03-14 ENCOUNTER — Telehealth: Payer: Self-pay | Admitting: *Deleted

## 2021-03-14 NOTE — Telephone Encounter (Signed)
-----   Message from Melvenia Beam, MD sent at 03/10/2021 12:04 AM EDT ----- Victoria Buckley call her and if she wants to see ENT then let me know and I can place the referral). Your brain and cervical spine looks fine, nothing concerning. Great news! You have some sinus disease in the ethmoid sinuses and a deviated septum. I'm not sure if the sinusitis could be causing your symptoms, we could send you ENT to evaluate this if you like. I'll ask my nurse Taleeya Blondin to call you on Monday. Thanks.  Dr. Jaynee Eagles

## 2021-03-14 NOTE — Telephone Encounter (Signed)
Called the pt and LVM (Ok per [facility]) advising of MRI brain and c-spine results. Asked pt to call us back or send mychart message if she would be willing to see ENT to see if sinus disease is causing her symptoms. Left office number in message.

## 2021-03-16 DIAGNOSIS — M79672 Pain in left foot: Secondary | ICD-10-CM | POA: Diagnosis not present

## 2021-04-09 DIAGNOSIS — Z1159 Encounter for screening for other viral diseases: Secondary | ICD-10-CM | POA: Diagnosis not present

## 2021-04-16 ENCOUNTER — Other Ambulatory Visit: Payer: Self-pay | Admitting: *Deleted

## 2021-04-16 DIAGNOSIS — J329 Chronic sinusitis, unspecified: Secondary | ICD-10-CM

## 2021-04-19 NOTE — Telephone Encounter (Signed)
Faxed referral to [facility] for Dr. Lacie Draft. Phone: [phone]. Fax: [phone].

## 2021-06-01 DIAGNOSIS — J3489 Other specified disorders of nose and nasal sinuses: Secondary | ICD-10-CM | POA: Diagnosis not present

## 2021-06-01 DIAGNOSIS — J343 Hypertrophy of nasal turbinates: Secondary | ICD-10-CM | POA: Diagnosis not present

## 2021-06-01 DIAGNOSIS — J342 Deviated nasal septum: Secondary | ICD-10-CM | POA: Diagnosis not present

## 2021-06-01 DIAGNOSIS — J301 Allergic rhinitis due to pollen: Secondary | ICD-10-CM | POA: Diagnosis not present

## 2021-06-19 DIAGNOSIS — J301 Allergic rhinitis due to pollen: Secondary | ICD-10-CM | POA: Diagnosis not present

## 2021-11-21 DIAGNOSIS — L930 Discoid lupus erythematosus: Secondary | ICD-10-CM | POA: Diagnosis not present

## 2021-11-28 DIAGNOSIS — L931 Subacute cutaneous lupus erythematosus: Secondary | ICD-10-CM | POA: Diagnosis not present

## 2022-01-10 DIAGNOSIS — H16141 Punctate keratitis, right eye: Secondary | ICD-10-CM | POA: Diagnosis not present

## 2022-01-15 DIAGNOSIS — H16143 Punctate keratitis, bilateral: Secondary | ICD-10-CM | POA: Diagnosis not present

## 2022-02-01 DIAGNOSIS — L931 Subacute cutaneous lupus erythematosus: Secondary | ICD-10-CM | POA: Diagnosis not present

## 2022-02-01 DIAGNOSIS — Z79899 Other long term (current) drug therapy: Secondary | ICD-10-CM | POA: Diagnosis not present

## 2022-08-30 DIAGNOSIS — L931 Subacute cutaneous lupus erythematosus: Secondary | ICD-10-CM | POA: Diagnosis not present

## 2022-08-30 DIAGNOSIS — Z79899 Other long term (current) drug therapy: Secondary | ICD-10-CM | POA: Diagnosis not present

## 2022-10-30 ENCOUNTER — Ambulatory Visit
Admission: RE | Admit: 2022-10-30 | Discharge: 2022-10-30 | Disposition: A | Discharge status: Home / Self Care | Payer: BC Managed Care – PPO | Source: Ambulatory Visit | Attending: Nurse Practitioner | Admitting: Nurse Practitioner

## 2022-10-30 ENCOUNTER — Other Ambulatory Visit: Payer: Self-pay

## 2022-10-30 VITALS — BP 154/97 | HR 110 | Temp 100.0°F | Resp 22

## 2022-10-30 DIAGNOSIS — Z1152 Encounter for screening for COVID-19: Secondary | ICD-10-CM | POA: Insufficient documentation

## 2022-10-30 DIAGNOSIS — R6889 Other general symptoms and signs: Secondary | ICD-10-CM | POA: Insufficient documentation

## 2022-10-30 HISTORY — DX: Systemic lupus erythematosus, unspecified: M32.9

## 2022-10-30 LAB — RESP PANEL BY RT-PCR (FLU A&B, COVID) ARPGX2
Influenza A by PCR: POSITIVE — AB
Influenza B by PCR: NEGATIVE
SARS Coronavirus 2 by RT PCR: NEGATIVE

## 2022-10-30 MED ORDER — ONDANSETRON 4 MG PO TBDP: 4.0000 mg | ORAL_TABLET | Freq: Three times a day (TID) | ORAL | 0 refills | Status: DC | PRN

## 2022-10-30 NOTE — ED Provider Notes (Signed)
[facility]    CSN: [id number] Arrival date & time: 10/30/22  1534      History   Chief Complaint Chief Complaint  Patient presents with   Fever    Fever, chills, coughing x 4 days. Temp 102.7 first 2 days. 2 negative at home covid tests - Entered by patient    HPI Victoria Buckley is a 41 y.o. female.   The history is provided by the patient.   Patient presents with a 3-day history of flulike symptoms.  Symptoms include fever, chills, chest congestion, cough, generalized body aches, nausea, vomiting, and increased heart rate.  Patient denies headache, sore throat, wheezing, shortness of breath, or difficulty breathing.  Patient reports that she has taken 2 COVID test, both were negative.  She states that she has been taking over-the-counter cough and cold medicine, her last dose was last night.  She states her last fever was earlier today prior to her arrival, with her last episode of vomiting 1 day ago.  She denies any obvious sick contacts.   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   Fatigue 06/17/2016   Benign melanoma 06/17/2016   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 06/17/2016   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      Home Medications    Prior to Admission medications   Medication Sig Start Date End Date Taking? Authorizing Provider  ondansetron (ZOFRAN-ODT) 4 MG disintegrating tablet Take 1 tablet (4 mg total) by mouth every 8 (eight) hours as needed for nausea or vomiting. 10/30/22  Yes Nyaira Hodgens-Warren, Alda Lea, NP  Butalbital-APAP-Caffeine 50-300-40 MG CAPS Take 1-2 tablets by mouth 2 (two) times daily as needed (for migraines). Do not take more than 6 pills in 24 hours 10/05/19   McLean-Scocuzza, Nino Glow, MD  cetirizine (ZYRTEC) 10 MG tablet Take 1 tablet (10 mg total) by mouth daily as needed for allergies. 10/13/19   McLean-Scocuzza, Nino Glow, MD  Cholecalciferol 1.25 MG (50000 UT) capsule Take 1 capsule (50,000 Units total) by mouth once a week. 10/24/19   McLean-Scocuzza, Nino Glow, MD  hydrOXYzine (ATARAX/VISTARIL) 25 MG tablet Take 1-2 tablets (25-50 mg total) by mouth 2 (two) times daily as needed. Or qhs prn 10/13/19   McLean-Scocuzza, Nino Glow,  MD  montelukast (SINGULAIR) 10 MG tablet Take 10 mg by mouth at bedtime.    [provider]  ondansetron (ZOFRAN) 4 MG tablet Take 1 tablet (4 mg total) by mouth every 8 (eight) hours as needed for nausea or vomiting. 10/13/19   McLean-Scocuzza, Nino Glow, MD    Family History Family History  Problem Relation Age of Onset   Osteoporosis Mother    Scoliosis Mother    Arthritis Mother    Depression Mother    Hyperlipidemia Mother    Migraines Mother    ADD / ADHD Mother        "adult ADHD"   Stroke Maternal Grandmother    Arthritis Maternal Grandmother    Dementia Maternal Grandfather     Heart disease Maternal Grandfather    Hypertension Maternal Grandfather    Hyperlipidemia Maternal Grandfather    Hyperlipidemia Paternal Grandmother    Kidney disease Paternal Grandmother    Diabetes Paternal Grandmother    Asthma Paternal Grandfather    COPD Paternal Grandfather    Colon cancer Neg Hx    Esophageal cancer Neg Hx     Social History Social History   Tobacco Use   Smoking status: Never   Smokeless tobacco: Never  Vaping Use   Vaping Use: Never used  Substance Use Topics   Alcohol use: Yes    Alcohol/week: 0.0 standard drinks of alcohol    Comment: occas   Drug use: Never     Allergies   Citalopram and Zoloft [sertraline hcl]   Review of Systems Review of Systems Per HPI  Physical Exam Triage Vital Signs ED Triage Vitals  Enc Vitals Group     BP 10/30/22 1627 (!) 154/97     Pulse Rate 10/30/22 1627 (!) 110     Resp 10/30/22 1627 (!) 22     Temp 10/30/22 1627 100 F (37.8 C)     Temp Source 10/30/22 1627 Oral     SpO2 10/30/22 1627 96 %     Weight --      Height --      Head Circumference --      Peak Flow --      Pain Score 10/30/22 1628 4     Pain Loc --      Pain Edu? --      Excl. in [place]? --    No data found.  Updated Vital Signs BP (!) 154/97 (BP Location: Right Arm)   Pulse (!) 110   Temp 100 F (37.8 C) (Oral)   Resp (!) 22   LMP 10/14/2022 (Approximate)   SpO2 96%   Visual Acuity Right Eye Distance:   Left Eye Distance:   Bilateral Distance:    Right Eye Near:   Left Eye Near:    Bilateral Near:     Physical Exam Vitals and nursing note reviewed.  Constitutional:      General: She is not in acute distress.    Appearance: Normal appearance.  HENT:     Head: Normocephalic.     Right Ear: Tympanic membrane, ear canal and external ear normal.     Left Ear: Tympanic membrane, ear canal and external ear normal.     Nose: Nose normal.     Mouth/Throat:     Mouth: Mucous membranes are moist.  Eyes:     Extraocular  Movements: Extraocular movements intact.     Conjunctiva/sclera: Conjunctivae normal.     Pupils: Pupils are equal, round, and reactive to  light.  Cardiovascular:     Rate and Rhythm: Regular rhythm.     Pulses: Normal pulses.     Heart sounds: Normal heart sounds.  Pulmonary:     Effort: Pulmonary effort is normal. No respiratory distress.     Breath sounds: Normal breath sounds. No stridor. No wheezing, rhonchi or rales.  Abdominal:     General: Bowel sounds are normal.     Palpations: Abdomen is soft.     Tenderness: There is no abdominal tenderness.  Musculoskeletal:     Cervical back: Normal range of motion.  Lymphadenopathy:     Cervical: No cervical adenopathy.  Skin:    General: Skin is warm and dry.  Neurological:     General: No focal deficit present.     Mental Status: She is alert and oriented to person, place, and time.  Psychiatric:        Mood and Affect: Mood normal.        Behavior: Behavior normal.      UC Treatments / Results  Labs (all labs ordered are listed, but only abnormal results are displayed) Labs Reviewed  RESP PANEL BY RT-PCR (FLU A&B, COVID) [id number]    EKG   Radiology No results found.  Procedures Procedures (including critical care time)  Medications Ordered in UC Medications - No data to display  Initial Impression / Assessment and Plan / UC Course  I have reviewed the triage vital signs and the nursing notes.  Pertinent labs & imaging results that were available during my care of the patient were reviewed by me and considered in my medical decision making (see chart for details).  Patient presents with a 3-day history of flulike symptoms.  On exam, she is hypertensive and tachycardic along with being mildly tachypneic.  She is in no acute distress, and is otherwise well-appearing.  Symptoms are consistent with a viral illness, given the history of negative home COVID test, symptoms are consistent with flulike symptoms.  Will  provide symptomatic treatment for the patient with Zofran 4 mg.  Patient is requesting a flu test for her job.  COVID/flu test is pending.  Supportive care recommendations were provided to the patient to include increase fluids, allow for plenty of rest, and Tylenol or ibuprofen for pain or general discomfort.  Patient was provided a work note.  Patient verbalizes understanding.  With strict return precautions.  All questions were answered.  Patient is stable for discharge. Final Clinical Impressions(s) / UC Diagnoses   Final diagnoses:  Flu-like symptoms     Discharge Instructions      Take medication as prescribed. Increase fluids. Try to drink at least 8 to 10 8oz glasses of water while symptoms persist. Continue Tylenol or Ibuprofen as needed for pain, fever, or general discomfort. Recommend a clear diet until nausea and vomiting improve, this includes soups, broth, jell-o and popsicles. When able try to add foods that are high in fiber such as wheat toast, etc. Follow-up with your primary care physician or in this clinic if symptoms do not improve over the next 7 to 10 days. Follow-up sooner if symptoms worsen.       ED Prescriptions     Medication Sig Dispense Auth. Provider   ondansetron (ZOFRAN-ODT) 4 MG disintegrating tablet Take 1 tablet (4 mg total) by mouth every 8 (eight) hours as needed for nausea or vomiting. 20 tablet Gifford Ballon-Warren, Alda Lea, NP      PDMP not reviewed this encounter.   Axcel Horsch-Warren,  Alda Lea, NP 10/30/22 1652

## 2022-10-30 NOTE — ED Triage Notes (Signed)
Pt reports woke up Sunday vomiting, pt reports chest congestion, intermittent vomiting, generalized body aches, elevated heart rate, and fevers ever since. Pt reports has taken home covid tests x2 with negative result. Last dose of otc cold medication was last night.

## 2022-10-30 NOTE — Discharge Instructions (Addendum)
Take medication as prescribed. Increase fluids. Try to drink at least 8 to 10 8oz glasses of water while symptoms persist. Continue Tylenol or Ibuprofen as needed for pain, fever, or general discomfort. Recommend a clear diet until nausea and vomiting improve, this includes soups, broth, jell-o and popsicles. When able try to add foods that are high in fiber such as wheat toast, etc. Follow-up with your primary care physician or in this clinic if symptoms do not improve over the next 7 to 10 days. Follow-up sooner if symptoms worsen.

## 2023-01-01 DIAGNOSIS — F419 Anxiety disorder, unspecified: Secondary | ICD-10-CM | POA: Diagnosis not present

## 2023-05-08 DIAGNOSIS — F419 Anxiety disorder, unspecified: Secondary | ICD-10-CM | POA: Diagnosis not present

## 2023-05-08 DIAGNOSIS — Z0000 Encounter for general adult medical examination without abnormal findings: Secondary | ICD-10-CM | POA: Diagnosis not present

## 2023-05-15 DIAGNOSIS — Z1331 Encounter for screening for depression: Secondary | ICD-10-CM | POA: Diagnosis not present

## 2023-05-15 DIAGNOSIS — Z0000 Encounter for general adult medical examination without abnormal findings: Secondary | ICD-10-CM | POA: Diagnosis not present

## 2024-05-25 DIAGNOSIS — Z79899 Other long term (current) drug therapy: Secondary | ICD-10-CM | POA: Diagnosis not present

## 2024-05-25 DIAGNOSIS — R7989 Other specified abnormal findings of blood chemistry: Secondary | ICD-10-CM | POA: Diagnosis not present

## 2024-06-01 DIAGNOSIS — Z1331 Encounter for screening for depression: Secondary | ICD-10-CM | POA: Diagnosis not present

## 2024-06-01 DIAGNOSIS — R202 Paresthesia of skin: Secondary | ICD-10-CM | POA: Diagnosis not present

## 2024-06-01 DIAGNOSIS — Z1339 Encounter for screening examination for other mental health and behavioral disorders: Secondary | ICD-10-CM | POA: Diagnosis not present

## 2024-06-01 DIAGNOSIS — Z0000 Encounter for general adult medical examination without abnormal findings: Secondary | ICD-10-CM | POA: Diagnosis not present

## 2024-07-15 ENCOUNTER — Encounter: Payer: Self-pay | Admitting: Neurology

## 2024-07-15 ENCOUNTER — Ambulatory Visit (INDEPENDENT_AMBULATORY_CARE_PROVIDER_SITE_OTHER): Admitting: Neurology

## 2024-07-15 VITALS — BP 131/83 | HR 75 | Ht 64.0 in | Wt 121.4 lb

## 2024-07-15 DIAGNOSIS — R202 Paresthesia of skin: Secondary | ICD-10-CM

## 2024-07-15 DIAGNOSIS — R200 Anesthesia of skin: Secondary | ICD-10-CM | POA: Diagnosis not present

## 2024-07-15 DIAGNOSIS — R29898 Other symptoms and signs involving the musculoskeletal system: Secondary | ICD-10-CM

## 2024-07-15 NOTE — Patient Instructions (Addendum)
 Emg/ncs  Electromyoneurogram Electromyoneurogram is a test to check how well your muscles and nerves are working. This procedure includes the combined use of electromyogram (EMG) and nerve conduction study (NCS). EMG is used to evaluate muscles and the nerves that control those muscles. NCS, which is also called electroneurogram, measures how well your nerves conduct electricity. The procedures should be done together to check if your muscles and nerves are healthy. If the results of the tests are abnormal, this may indicate disease or injury, such as a neuromuscular disease or peripheral nerve damage. Tell a health care provider about: Any allergies you have. All medicines you are taking, including vitamins, herbs, eye drops, creams, and over-the-counter medicines. Any bleeding problems you have. Any surgeries you have had. Any medical conditions you have. What are the risks? Generally, this is a safe procedure. However, problems may occur, including: Bleeding or bruising. Infection where the electrodes were inserted. What happens before the test? Medicines Take all of your usually prescribed medications before this testing is performed. Do not stop your blood thinners unless advised by your prescribing physician. General instructions Your health care provider may ask you to warm the limb that will be checked with warm water, hot pack, or wrapping the limb in a blanket. Do not use lotions or creams on the same day that you will be having the procedure. What happens during the test? For EMG  Your health care provider will ask you to stay in a position so that the muscle being studied can be accessed. You will be sitting or lying down. You may be given a medicine to numb the area (local anesthetic) and the skin will be disinfected. A very thin needle that has an electrode will be inserted into your muscle, one muscle at a time. Typically, multiple muscles are evaluated during a single  study. Another small electrode will be placed on your skin near the muscle. Your health care provider will ask you to continue to remain still. The electrodes will record the electrical activity of your muscles. You may see this on a monitor or hear it in the room. After your muscles have been studied at rest, your health care provider will ask you to contract or flex your muscles. The electrodes will record the electrical activity of your muscles. Your health care provider will remove the electrodes and the electrode needle when the procedure is finished. The procedure may vary among health care providers and hospitals. For NCS  An electrode that records your nerve activity (recording electrode) will be placed on your skin by the muscle that is being studied. An electrode that is used as a reference (reference electrode) will be placed near the recording electrode. A paste or gel will be applied to your skin between the recording electrode and the reference electrode. Your nerve will be stimulated with a mild shock. The speed of the nerves and strength of response is recorded by the electrodes. Your health care provider will remove the electrodes and the gel when the procedure is finished. The procedure may vary among health care providers and hospitals. What can I expect after the test? It is up to you to get your test results. Ask your health care provider, or the department that is doing the test, when your results will be ready. Your health care provider may: Give you medicines for any pain. Monitor the insertion sites to make sure that bleeding stops. You should be able to drive yourself to and from the  test. Discomfort can persist for a few hours after the test, but should be better the next day. Contact a health care provider if: You have swelling, redness, or drainage at any of the insertion sites. Summary Electromyoneurogram is a test to check how well your muscles and nerves are  working. If the results of the tests are abnormal, this may indicate disease or injury. This is a safe procedure. However, problems may occur, such as bleeding and infection. Your health care provider will do two tests to complete this procedure. One checks your muscles (EMG) and another checks your nerves (NCS). It is up to you to get your test results. Ask your health care provider, or the department that is doing the test, when your results will be ready. This information is not intended to replace advice given to you by your health care provider. Make sure you discuss any questions you have with your health care provider. Document Revised: 08/01/2021 Document Reviewed: 07/01/2021 Elsevier Patient Education  2024 [employer] Peroneal entrapment at the knee vs sciatica?  Superficial Peroneal Nerve Entrapment  Superficial peroneal nerve entrapment is a condition that results from pressure on a nerve in the lower leg (superficial peroneal nerve). This nerve provides feeling to the outside half of the front of your lower leg and the top of your foot and toes. It also supplies the outer muscles of your lower leg that help your foot move outward. The superficial peroneal nerve begins below the outside of your knee and runs down along your lower leg bone (fibula) to the top of your foot. Superficial peroneal nerve entrapment can cause weakness and numbness in your leg and foot. It may also cause pain. This can happen anywhere from your lower leg to your ankle. What are the causes? This condition may be caused by: A hard, direct hit to the outside of your lower leg. Ankle sprains. A break (fracture) in the fibula. What increases the risk? You are more likely to develop this condition if you take part in certain sports or activities, such as: Ballet dancing. Contact sports, such as football, lacrosse, or martial arts. Sports that involve changing direction quickly, such as soccer. Sports that  take place on uneven surfaces, such as trail running. Sports that involve wearing high boots, such as skiing. What are the signs or symptoms? Symptoms of this condition include: Numbness and tingling over the outside half of your shin and the top of your foot and toes. Pain or tenderness on the outside of your leg or the top of your foot. Inability to move your foot outward or weakness when doing so. Symptoms of this condition may start quickly or may develop over time. They often get better with rest and get worse after activity. How is this diagnosed? This condition may be diagnosed based on: Your symptoms and medical history. A physical exam. During the exam, your health care provider may: Check for numbness and test the strength of your lower leg muscles. Tap the side of your leg or ankle to see if it causes a tingling sensation. Inject a numbing medicine into the nerve to see if your symptoms go away. Imaging tests, such as: X-rays to check your ankle and fibula for fractures. An MRI to check tendons and ligaments. An ultrasound to check the nerve. An electrical study of the nerve's function (electromyography, or EMG). How is this treated? Treatment for this condition may include: Using crutches. Avoiding activities that make symptoms worse. Taking anti-inflammatory pain  medicines to relieve swelling and reduce pain. Having medicines injected near your nerve to reduce pain and swelling. Starting range-of-motion exercises and strengthening exercises (physical therapy). Returning gradually to full activity. Wearing a supportive brace, a shoe, or a shoe insert (orthotic). Surgery to take pressure off the nerve may be needed if there is no improvement in 2-3 months or if there is a growth on the nerve. Follow these instructions at home: If you have a removable brace: Wear the brace as told by your health care provider. Remove it only as told by your health care provider. Check the  skin around the brace every day. Tell your health care provider about any concerns. Loosen the brace if your toes tingle, become numb, or turn cold and blue. Keep the brace clean and dry. If the brace is not waterproof: Do not let it get wet. Cover it with a watertight covering when you take a bath or shower. Activity Do not do any activities that make pain or swelling worse. Do not use the injured limb to support your body weight until your health care provider says that you can. Use crutches as told by your health care provider. Do exercises as told by your health care provider. Return to your normal activities as told by your health care provider. Ask your health care provider what activities are safe for you. General instructions Take over-the-counter and prescription medicines only as told by your health care provider. Wear your supportive shoe or your shoe insert as told by your health care provider. Keep all follow-up visits. This is important. How is this prevented? Wear supportive footwear that is appropriate for your athletic activity. Make sure your shoes or boots fit well and are not too tight. See your health care provider if you have an ankle sprain that causes pain and swelling for more than 2 weeks. If you start a new athletic activity, start gradually to build up your strength and flexibility. Contact a health care provider if: Your symptoms do not improve in 2-3 months. You have increasing weakness or numbness in your leg or foot. Summary Superficial peroneal nerve entrapment is a condition that results from pressure on a nerve in the lower leg. This condition can cause weakness, pain, and numbness in your leg and foot. It is caused by injury to the lower leg. It is treated with rest, medicines to reduce pain and swelling, physical therapy, and surgery if needed. Contact a health care provider if your symptoms do not improve in 2-3 months. This information is not  intended to replace advice given to you by your health care provider. Make sure you discuss any questions you have with your health care provider. Document Revised: 04/19/2021 Document Reviewed: 04/19/2021 Elsevier Patient Education  2024 Elsevier Inc.           Cubital Tunnel Syndrome  Cubital tunnel syndrome is a condition that causes pain, numbness, tingling, and weakness of the forearm and hand. It happens when your ulnar nerve is irritated or pinched (compressed). The ulnar nerve runs from your shoulder to the small finger (pinkie) of your hand. In most cases, cubital tunnel syndrome is caused by arm motions that are done over and over during sports or while at work. What are the causes? This condition may be caused by: Pressure on the ulnar nerve. This may be from: Repeated elbow bending. Poorly healed broken bones (fractures). Tumors in the elbow. In most cases, these are not cancer. Scar tissue that forms in  the elbow after an injury. Bony growths (spurs) near the ulnar nerve. Stretching of the nerve. This may happen when the tissues that connect bones to each other (ligaments) become loose. Trauma to the nerve at the elbow. What increases the risk? You may be more likely to get this condition if: You do manual labor and have to bend your elbow a lot. You play sports that include a lot of throwing motions, such as baseball. You play contact sports, such as football or lacrosse. You do not warm up enough before you do activities. You have diabetes. You have hypothyroidism. This is when your thyroid does not make enough hormones. What are the signs or symptoms? Symptoms of this condition include: Clumsiness or weakness of the hand. You also may not be able to grip or pinch firmly. Aching, soreness, or tenderness of the inner elbow, forearm, or fingers. You may feel this most in your pinkie or ring finger. More pain when you force your elbow to bend or shooting pain from  your elbow to your hand. Reduced control when you throw objects. Tingling, numbness, or a burning feeling in your forearm, hand, or fingers. You may feel this most in your pinkie or ring finger. How is this diagnosed? This condition is diagnosed based on your symptoms, your medical history, and a physical exam. Your health care provider will ask about any injuries. You may also have tests, such as: An electromyogram (EMG). This is done to see how well your nerves are working. A nerve conduction study (NCS). This is done to see how electrical signals pass through your nerves. Imaging tests, such as X-rays, ultrasound, and MRI. These are done to find the source of your nerve problems. How is this treated? This condition may be treated by: Stopping the activities that make your symptoms worse. Icing your elbow. Taking medicines to reduce pain and swelling. Wearing a removable brace. This stops your elbow from bending. Wearing an elbow pad where the ulnar nerve is closest to the skin. Working with a physical therapist. This may help your symptoms get better. It can also help you improve the strength and range of motion of your elbow, forearm, and hand. Steroids. These are medicines that are injected into your body to reduce inflammation. If these treatments do not help, you may need surgery. Follow these instructions at home: Medicines Take over-the-counter and prescription medicines only as told by your provider. Ask your provider if the medicine prescribed to you requires you to avoid driving or using machinery. If you have a removable brace: Wear the brace as told by your provider. Remove it only as told by your provider. Check the skin around the brace every day. Tell your provider about any concerns. Loosen the brace if your fingers tingle, become numb, or turn cold and blue. Keep the brace clean. If the brace is not waterproof: Do not let it get wet. Cover it with a watertight  covering when you take a bath or shower. Managing pain, stiffness, and swelling  If told, put ice on the injured area. If you have a removable brace, remove it as told by your provider. Put ice in a plastic bag. Place a towel between your skin and the bag. Leave the ice on for 20 minutes, 2-3 times a day. If your skin turns bright red, remove the ice right away to prevent skin damage. The risk of damage is higher if you cannot feel pain, heat, or cold. Move your fingers often to  reduce stiffness and swelling. Raise (elevate) the injured area above the level of your heart while you are sitting or lying down. General instructions Do exercises or physical therapy as told by your provider. If you were given an elbow pad or brace, wear it as told by your provider. Keep all follow-up visits. Your provider will check if your symptoms are getting better. They can also adjust the treatment if needed. Contact a health care provider if: Your symptoms get worse. Your symptoms do not get better with treatment. You have new pain. Your hand on the injured side feels numb or cold. This information is not intended to replace advice given to you by your health care provider. Make sure you discuss any questions you have with your health care provider. Document Revised: 09/06/2022 Document Reviewed: 09/06/2022 Elsevier Patient Education  2024 [employer].

## 2024-07-15 NOTE — Progress Notes (Signed)
 [facility]    Provider:  Dr Ines Requesting Provider: Stephane Leita DEL, MD Primary Care Provider:  Patient, No Pcp Per  CC:  left arm tingling  HPI:  Victoria Buckley is a 43 y.o. female here as requested by Stephane Leita DEL, MD for numbness and tingling left arm. 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 We saw patient in 2022, I reviewed my notes and at that time  she had an unusual history of left-sided facial numbness that started acutely, worsened to a nadir, then slowly improved but still persistent, mri of the brain and c-spine was negative.   Last summer she was picking up the couch and she started noticing weakness in the left arm, muscle smaller, slight tremor with action and pinching makes it worse, also some numbness in the pinky and ring finger, also in addition toes of the left foot ae numb sciatica type tyoe feeling. Left arm worsening. Numbness shoots from the left elbow, shooting pains in her arm, not coming from the neck, when she sleeps almost every night she wakes up with hand asleep. 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 No hx of tremors in the family. Toes have been numb for a long time, progressive, she has to shift at work because of the numbness in the left toes no pain in the back or radicular symptoms, no trauma, no injuries, she has had xrays.    Reviewed notes, labs and imaging from outside physicians, which showed:  In July had TSH checked normal, reviewed lab July 2025 normal, cbc/cmp normal  Reviewed images and agree:  03/07/2021 MRI cervical spine and  brain IMPRESSION: Unremarkable MRI scan cervical spine with and without contrast showing only minor disc signal abnormalities  And mis alignment of the vertebrae but no frank disc herniation or compression.  IMPRESSION: Unremarkable MRI scan of the brain with and without contrast.     Review of Systems: Patient complains of symptoms per HPI as well as the following symptoms per hpi. Pertinent negatives and positives per HPI. All others negative.   Social History   Socioeconomic History   Marital status: Single    Spouse name: Not on file   Number of children: Not on file   Years of education: Not on file   Highest education level: Not on file  Occupational History   Occupation: CMA  Tobacco Use   Smoking status: Never   Smokeless tobacco: Never  Vaping Use   Vaping status: Never Used  Substance and Sexual Activity   Alcohol use: Yes    Alcohol/week: 0.0 standard drinks of alcohol    Comment: occas   Drug use: Never   Sexual activity: Yes  Other Topics Concern   Not on file  Social History Narrative   Only child    No kids 2 dogs and 4 cats    No kids as of age 68 y.o    Never smoker    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 Rolan Molt   2 associates degrees   Right handed   Lives with one other person   Caffeine:  4-5 cups caffeine per day  Social Drivers of Corporate investment banker Strain: Not on file  Food Insecurity: Not on file  Transportation Needs: Not on file  Physical Activity: Not on file  Stress: Not on file  Social Connections: Not on file  Intimate Partner Violence: Not on file    Family History  Problem Relation Age of Onset   Osteoporosis Mother    Scoliosis Mother    Arthritis Mother    Depression Mother    Hyperlipidemia Mother    Migraines Mother    ADD / ADHD Mother        adult ADHD   Stroke Maternal Grandmother    Arthritis Maternal Grandmother    Dementia Maternal Grandfather    Heart disease Maternal Grandfather     Hypertension Maternal Grandfather    Hyperlipidemia Maternal Grandfather    Hyperlipidemia Paternal Grandmother    Kidney disease Paternal Grandmother    Diabetes Paternal Grandmother    Asthma Paternal Grandfather    COPD Paternal Grandfather    Colon cancer Neg Hx    Esophageal cancer Neg Hx     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   Fatigue 06/17/2016   Melanocytic nevus 06/17/2016   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 06/17/2016   Ear drum perforation 06/17/2016   Vitamin D deficiency 06/17/2016   Anxiety and depression 08/25/2009   Candidal vulvovaginitis 06/01/2009   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     No current facility-administered medications for this visit.    Allergies as of 07/15/2024 - Review Complete 07/15/2024  Allergen Reaction Noted   Citalopram Hives, Itching, Nausea Only, and Shortness Of Breath 06/17/2016    Zoloft  [sertraline  hcl]  10/05/2019    Vitals: BP 131/83   Pulse 75   Ht 5' 4 (1.626 m)   Wt 121 lb 6.4 oz (55.1 kg)   BMI 20.84 kg/m  Last Weight:  Wt Readings from Last 1 Encounters:  07/15/24 121 lb 6.4 oz (55.1 kg)   Last Height:   Ht Readings from Last 1 Encounters:  07/15/24 5' 4 (1.626 m)     Physical exam: Exam: Gen: NAD, conversant, well nourised, obese, well groomed                     CV: RRR, no MRG. No Carotid  Bruits. No peripheral edema, warm, nontender Eyes: Conjunctivae clear without exudates or hemorrhage  Neuro: Detailed Neurologic Exam  Speech:    Speech is normal; fluent and spontaneous with normal comprehension.  Cognition:    The patient is oriented to person, place, and time;     recent and remote memory intact;     language fluent;     normal attention, concentration,     fund of knowledge Cranial Nerves:    The pupils are equal, round, and reactive to light. The fundi are normal and spontaneous venous pulsations are present. Visual fields are full to finger confrontation. Extraocular movements are intact. Trigeminal sensation is intact and the muscles of mastication are normal. The face is symmetric. The palate elevates in the midline. Hearing intact. Voice is normal. Shoulder shrug is normal. The tongue has normal motion without fasciculations.   Coordination:    Normal finger to nose and heel to shin. Normal rapid alternating movements.   Gait:    Heel-toe and tandem gait are normal.   Motor Observation:    No asymmetry, no atrophy, and no involuntary movements noted. Tone:    Normal muscle tone.    Posture:    Posture is normal. normal erect    Strength:    Strength is V/V in the upper and lower limbs.  Weakness in the distall inervated ulnar muscle (ADM and FDP) on the left otherwise 5/5.     Sensation: decrease top of the foot pp     Reflex Exam:  DTR's:    Deep tendon reflexes in the upper and lower extremities are normal  bilaterally.   Toes:    The toes are downgoing bilaterally.   Clonus:    Clonus is absent.   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   Cc: Stephane Leita DEL, MD,  Patient, No Pcp Per  Onetha Epp, MD  [facility] [address] [place], [place] [address]  Phone [phone] Fax [phone]

## 2024-08-25 DIAGNOSIS — H04123 Dry eye syndrome of bilateral lacrimal glands: Secondary | ICD-10-CM | POA: Diagnosis not present

## 2024-08-25 DIAGNOSIS — H5213 Myopia, bilateral: Secondary | ICD-10-CM | POA: Diagnosis not present

## 2024-08-25 DIAGNOSIS — Z79899 Other long term (current) drug therapy: Secondary | ICD-10-CM | POA: Diagnosis not present

## 2024-08-26 ENCOUNTER — Telehealth: Payer: Self-pay | Admitting: Neurology

## 2024-08-26 NOTE — Telephone Encounter (Signed)
 Myc canc

## 2024-09-02 ENCOUNTER — Encounter: Admitting: Neurology

## 2024-09-02 NOTE — Telephone Encounter (Signed)
 LVM and sent mychart msg to discuss rescheduling NCV/EMG - MD departure

## 2024-10-21 DIAGNOSIS — Z1231 Encounter for screening mammogram for malignant neoplasm of breast: Secondary | ICD-10-CM | POA: Diagnosis not present

## 2024-11-03 ENCOUNTER — Encounter: Payer: Self-pay | Admitting: Neurology

## 2024-11-03 ENCOUNTER — Ambulatory Visit: Admitting: Neurology

## 2024-11-03 VITALS — BP 124/78

## 2024-11-03 DIAGNOSIS — R202 Paresthesia of skin: Secondary | ICD-10-CM | POA: Diagnosis not present

## 2024-11-03 DIAGNOSIS — R29898 Other symptoms and signs involving the musculoskeletal system: Secondary | ICD-10-CM | POA: Diagnosis not present

## 2024-11-03 DIAGNOSIS — R200 Anesthesia of skin: Secondary | ICD-10-CM | POA: Insufficient documentation

## 2024-11-03 DIAGNOSIS — G4710 Hypersomnia, unspecified: Secondary | ICD-10-CM | POA: Insufficient documentation

## 2024-11-03 MED ORDER — PROPRANOLOL HCL 20 MG PO TABS: 20.0000 mg | ORAL_TABLET | Freq: Two times a day (BID) | ORAL | 6 refills | Status: AC | PRN

## 2024-11-03 NOTE — Procedures (Signed)
 Full Name: Victoria Buckley Gender: Female MRN #: [id number] Date of Birth: [birth year]    Visit Date: 11/03/2024 10:12 Age: 43 Years Examining Physician: Onita Duos Referring Physician: Ines Height: 5 feet 4 inch History: 43 year old female with intermittent numbness of left upper lower extremity  Summary of the test:  Nerve conduction study:  Left sural, superficial peroneal, median ulnar sensory responses were normal.  Left peroneal to EDB tibial, median and ulnar motor responses were normal.  Electromyography:  Selected needle examination of the left upper, lower extremity, lumbar and cervical paraspinal muscles were normal.   Conclusion: This is a normal study.  There is no electrodiagnostic evidence of large fiber peripheral neuropathy.    ------------------------------- Duos Onita. M.D. Ph.D.   [facility] [address], [address] [place], [place] [address] Tel: [phone] Fax: [phone]  Verbal informed consent was obtained from the patient, patient was informed of potential risk of procedure, including bruising, bleeding, hematoma formation, infection, muscle weakness, muscle pain, numbness, among others.        MNC    Nerve / Sites Muscle Latency Ref. Amplitude Ref. Rel Amp Segments Distance Velocity Ref. Area    ms ms mV mV %  cm m/s m/s mVms  L Median - APB     Wrist APB 2.7 <=4.4 9.3 >=4.0 100 Wrist - APB 7   36.0     Upper arm APB 6.2  9.2  99.3 Upper arm - Wrist 22 63 >=49 35.4  L Ulnar - ADM     Wrist ADM 2.6 <=3.3 10.3 >=6.0 100 Wrist - ADM 7   23.2     B.Elbow ADM 4.5  9.0  87.6 B.Elbow - Wrist 15 77 >=49 23.8     A.Elbow ADM 6.5  8.9  98.7 A.Elbow - B.Elbow 12 62 >=49 23.3  L Peroneal - EDB     Ankle EDB 4.1 <=6.5 10.0 >=2.0 100 Ankle - EDB 9   28.5     Fib head EDB 9.7  9.2  91.7 Fib head - Ankle 28 51 >=44 26.7     Pop fossa EDB 11.4  10.7  116 Pop fossa - Fib head 8 47 >=44 34.7         Pop fossa - Ankle      L  Tibial - AH     Ankle AH 4.0 <=5.8 10.7 >=4.0 100 Ankle - AH 9   21.7     Pop fossa AH 12.8  9.6  90.2 Pop fossa - Ankle 38 43 >=41 22.6             SNC    Nerve / Sites Rec. Site Peak Lat Ref.  Amp Ref. Segments Distance    ms ms V V  cm  L Sural - Ankle (Calf)     Calf Ankle 3.8 <=4.4 14 >=6 Calf - Ankle 14  L Superficial peroneal - Ankle     Lat leg Ankle 3.5 <=4.4 14 >=6 Lat leg - Ankle 14  L Median - Orthodromic (Dig II, Mid palm)     Dig II Wrist 2.7 <=3.4 25 >=10 Dig II - Wrist 13  L Ulnar - Orthodromic, (Dig V, Mid palm)     Dig V Wrist 2.5 <=3.1 13 >=5 Dig V - Wrist 57             F  Wave    Nerve F Lat Ref.   ms ms  L Tibial - AH 45.5 <=56.0  L Ulnar - ADM 24.2 <=32.0         H Reflex    Nerve H Lat Lat Hmax   ms ms   Left Right Ref. Left Right Ref.  Tibial - Soleus 37.8 36.6 <=35.0 35.1 23.7 <=35.0         EMG Summary Table    Spontaneous MUAP Recruitment  Muscle IA Fib PSW Fasc Other Amp Dur. Poly Pattern  L. Tibialis anterior Normal None None None _______ Normal Normal Normal Normal  L. Tibialis posterior Normal None None None _______ Normal Normal Normal Normal  L. Peroneus longus Normal None None None _______ Normal Normal Normal Normal  L. Vastus lateralis Normal None None None _______ Normal Normal Normal Normal  L. Gastrocnemius (Medial head) Normal None None None _______ Normal Normal Normal Normal  L. Lumbar paraspinals (mid) Normal None None None _______ Normal Normal Normal Normal  L. Lumbar paraspinals (low) Normal None None None _______ Normal Normal Normal Normal  L. First dorsal interosseous Normal None None None _______ Normal Normal Normal Normal  L. Triceps brachii Normal None None None _______ Normal Normal Normal Normal  L. Deltoid Normal None None None _______ Normal Normal Normal Normal  L. Biceps brachii Normal None None None _______ Normal Normal Normal Normal  L. Extensor digitorum communis Normal None None None _______ Normal Normal  Normal Normal  L. Cervical paraspinals Normal None None None _______ Normal Normal Normal Normal

## 2024-11-03 NOTE — Progress Notes (Signed)
 Chief Complaint  Patient presents with   Follow-up    Pt in room EMG room 3. Alone.Here for NCS.       ASSESSMENT AND PLAN  Victoria Buckley is a 43 y.o. female   Intermittent left arm tremor,  Essentially normal neurological examination, may have mild posturing tremor, no rigidity bradykinesia,  Reported normal TSH by primary care recently  May try Inderal  20 mg twice a day as needed Excessive sleepiness fatigue, more narrow oropharyngeal space  High risk for obstructive sleep apnea, will refer to sleep study  DIAGNOSTIC DATA (LABS, IMAGING, TESTING) - I reviewed patient records, labs, notes, testing and imaging myself where available.   MEDICAL HISTORY:  Victoria Buckley, is a 42 year old female, EMG nerve conduction study was planned by Dr.   Ines, Onetha B, to evaluate her complaints of intermittent left arm paresthesia, tremor, intermittent lower extremity paresthesia  History is obtained from the patient and review of electronic medical records. I personally reviewed pertinent available imaging films in PACS.   PMHx of  Depression anxiety  She works at dermatology as a LAWYER, since 2024, she noticed gradual onset bilateral hands tremor, mostly her left nondominant hand, especially when she hyperextended her elbow, exerts tension, she had uncontrollable shaking of left arm, but not persistent, there was no consistent motor or sensory deficit, intermittent bilateral feet paresthesia  EMG nerve conduction study November 03, 2024 is normal  She also complains of loud snoring, excessive daytime sleepiness, fatigue, do have small jaw, narrow oropharyngeal space  PHYSICAL EXAM:   Vitals:   11/03/24 0911  BP: 124/78    PHYSICAL EXAMNIATION:  Gen: NAD, conversant, well nourised, well groomed                     Cardiovascular: Regular rate rhythm, no peripheral edema, warm, nontender. Eyes: Conjunctivae clear without exudates or hemorrhage Neck: Supple, no carotid  bruits. Pulmonary: Clear to auscultation bilaterally   NEUROLOGICAL EXAM:  MENTAL STATUS: Speech/cognition: Awake, alert, oriented to history taking and casual conversation CRANIAL NERVES: CN II: Visual fields are full to confrontation. Pupils are round equal and briskly reactive to light. CN III, IV, VI: extraocular movement are normal. No ptosis. CN V: Facial sensation is intact to light touch CN VII: Face is symmetric with normal eye closure  CN VIII: Hearing is normal to causal conversation. CN IX, X: Phonation is normal. CN XI: Head turning and shoulder shrug are intact  MOTOR: There is no pronator drift of out-stretched arms. Muscle bulk and tone are normal. Muscle strength is normal.  REFLEXES: Reflexes are 2+ and symmetric at the biceps, triceps, knees, and ankles. Plantar responses are flexor.  SENSORY: Intact to light touch, pinprick and vibratory sensation are intact in fingers and toes.  COORDINATION: There is no trunk or limb dysmetria noted.  GAIT/STANCE: Posture is normal. Gait is steady with normal steps, base, arm swing, and turning. Heel and toe walking are normal. Tandem gait is normal.  Romberg is absent.  REVIEW OF SYSTEMS:  Full 14 system review of systems performed and notable only for as above All other review of systems were negative.   ALLERGIES: Allergies  Allergen Reactions   Citalopram Hives, Itching, Nausea Only and Shortness Of Breath   Zoloft  [Sertraline  Hcl]     Hand tremor     HOME MEDICATIONS: Current Outpatient Medications  Medication Sig Dispense Refill   escitalopram (LEXAPRO) 10 MG tablet Take 10 mg by mouth daily.  No current facility-administered medications for this visit.    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    Vitamin D deficiency     PAST SURGICAL HISTORY: Past Surgical History:  Procedure Laterality Date   BUNIONECTOMY  1998   BUNIONECTOMY Right 10/19/2020   Procedure: Right foot scarf and akin osteotomies, modified Woodard bonds;  Surgeon: Kit Rush, MD;  Location: [facility];  Service: Orthopedics;  Laterality: Right;   DILATION AND CURETTAGE OF UTERUS  2006    FAMILY HISTORY: Family History  Problem Relation Age of Onset   Osteoporosis Mother    Scoliosis Mother    Arthritis Mother    Depression Mother    Hyperlipidemia Mother    Migraines Mother    ADD / ADHD Mother        adult ADHD   Stroke Maternal Grandmother    Arthritis Maternal Grandmother    Dementia Maternal Grandfather    Heart disease Maternal Grandfather    Hypertension Maternal Grandfather    Hyperlipidemia Maternal Grandfather    Hyperlipidemia Paternal Grandmother    Kidney disease Paternal Grandmother    Diabetes Paternal Grandmother    Asthma Paternal Grandfather    COPD Paternal Grandfather    Colon cancer Neg Hx    Esophageal cancer Neg Hx     SOCIAL HISTORY: Social History   Socioeconomic History   Marital status: Single    Spouse name: Not on file   Number of children: Not on file   Years of education: Not on file   Highest education level: Not on file  Occupational History   Occupation: CMA  Tobacco Use   Smoking status: Never   Smokeless tobacco: Never  Vaping Use   Vaping status: Never Used  Substance and Sexual Activity   Alcohol  use: Yes    Alcohol /week: 0.0 standard drinks of alcohol     Comment: occas   Drug use: Never   Sexual activity: Yes  Other Topics Concern   Not on file  Social History Narrative   Only child    No kids 2 dogs and 4 cats    No kids as of age 32 y.o    Never smoker     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 Ph.D.  [facility] [address] [place], [place] [address] Ph: [phone] Fax: [phone]  CC:  Ines Onetha NOVAK, MD [address] [place],  [place] [address]  Patient, No Pcp Per  -
